# Patient Record
Sex: Female | Born: 1995 | Race: White | Hispanic: Yes | Smoking: Never smoker
Health system: Southern US, Community
[De-identification: ages and names within clinical notes are randomized; demographics above are authoritative.]

## PROBLEM LIST (undated history)

## (undated) DIAGNOSIS — Z789 Other specified health status: Secondary | ICD-10-CM

## (undated) HISTORY — PX: NO PAST SURGERIES: SHX2092

---
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
## 2018-08-23 ENCOUNTER — Encounter: Payer: Self-pay | Admitting: Emergency Medicine

## 2018-08-23 ENCOUNTER — Emergency Department: Payer: Commercial Managed Care - PPO

## 2018-08-23 ENCOUNTER — Other Ambulatory Visit: Payer: Self-pay

## 2018-08-23 ENCOUNTER — Emergency Department
Admission: EM | Admit: 2018-08-23 | Discharge: 2018-08-23 | Disposition: A | Discharge status: Home / Self Care | Payer: Commercial Managed Care - PPO | Attending: Emergency Medicine | Admitting: Emergency Medicine

## 2018-08-23 DIAGNOSIS — S80812A Abrasion, left lower leg, initial encounter: Secondary | ICD-10-CM | POA: Diagnosis not present

## 2018-08-23 DIAGNOSIS — S20419A Abrasion of unspecified back wall of thorax, initial encounter: Secondary | ICD-10-CM | POA: Diagnosis not present

## 2018-08-23 DIAGNOSIS — S40811A Abrasion of right upper arm, initial encounter: Secondary | ICD-10-CM | POA: Insufficient documentation

## 2018-08-23 DIAGNOSIS — S80811A Abrasion, right lower leg, initial encounter: Secondary | ICD-10-CM | POA: Diagnosis not present

## 2018-08-23 DIAGNOSIS — S59902A Unspecified injury of left elbow, initial encounter: Secondary | ICD-10-CM | POA: Diagnosis present

## 2018-08-23 DIAGNOSIS — Y92410 Unspecified street and highway as the place of occurrence of the external cause: Secondary | ICD-10-CM | POA: Diagnosis not present

## 2018-08-23 DIAGNOSIS — W19XXXA Unspecified fall, initial encounter: Secondary | ICD-10-CM

## 2018-08-23 DIAGNOSIS — S30810A Abrasion of lower back and pelvis, initial encounter: Secondary | ICD-10-CM | POA: Insufficient documentation

## 2018-08-23 DIAGNOSIS — S40812A Abrasion of left upper arm, initial encounter: Secondary | ICD-10-CM | POA: Diagnosis not present

## 2018-08-23 DIAGNOSIS — S51012A Laceration without foreign body of left elbow, initial encounter: Secondary | ICD-10-CM | POA: Insufficient documentation

## 2018-08-23 DIAGNOSIS — Y998 Other external cause status: Secondary | ICD-10-CM | POA: Insufficient documentation

## 2018-08-23 DIAGNOSIS — Y9389 Activity, other specified: Secondary | ICD-10-CM | POA: Diagnosis not present

## 2018-08-23 DIAGNOSIS — T07XXXA Unspecified multiple injuries, initial encounter: Secondary | ICD-10-CM

## 2018-08-23 MED ORDER — LIDOCAINE-EPINEPHRINE 2 %-1:100000 IJ SOLN
INTRAMUSCULAR | Status: AC
  Administered 2018-08-23: 10 mL
  Filled 2018-08-23: qty 1

## 2018-08-23 MED ORDER — LIDOCAINE-EPINEPHRINE 1 %-1:100000 IJ SOLN: 10.0000 mL | Freq: Once | INTRAMUSCULAR | Status: DC

## 2018-08-23 MED ORDER — IBUPROFEN 400 MG PO TABS
400.0000 mg | ORAL_TABLET | Freq: Once | ORAL | Status: DC
  Filled 2018-08-23: qty 1

## 2018-08-23 NOTE — ED Notes (Signed)
Boyfriend arrived at bedside
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
## 2019-05-16 ENCOUNTER — Emergency Department
Admission: EM | Admit: 2019-05-16 | Discharge: 2019-05-16 | Disposition: A | Discharge status: Home / Self Care | Payer: Commercial Managed Care - PPO | Attending: Emergency Medicine | Admitting: Emergency Medicine

## 2019-05-16 ENCOUNTER — Emergency Department: Payer: Commercial Managed Care - PPO

## 2019-05-16 ENCOUNTER — Other Ambulatory Visit: Payer: Self-pay

## 2019-05-16 DIAGNOSIS — Y92008 Other place in unspecified non-institutional (private) residence as the place of occurrence of the external cause: Secondary | ICD-10-CM | POA: Diagnosis not present

## 2019-05-16 DIAGNOSIS — O9989 Other specified diseases and conditions complicating pregnancy, childbirth and the puerperium: Secondary | ICD-10-CM | POA: Insufficient documentation

## 2019-05-16 DIAGNOSIS — R109 Unspecified abdominal pain: Secondary | ICD-10-CM | POA: Diagnosis not present

## 2019-05-16 DIAGNOSIS — Y9389 Activity, other specified: Secondary | ICD-10-CM | POA: Insufficient documentation

## 2019-05-16 DIAGNOSIS — S3991XA Unspecified injury of abdomen, initial encounter: Secondary | ICD-10-CM | POA: Diagnosis present

## 2019-05-16 DIAGNOSIS — W2209XA Striking against other stationary object, initial encounter: Secondary | ICD-10-CM | POA: Insufficient documentation

## 2019-05-16 DIAGNOSIS — Z3A01 Less than 8 weeks gestation of pregnancy: Secondary | ICD-10-CM | POA: Diagnosis not present

## 2019-05-16 DIAGNOSIS — Y999 Unspecified external cause status: Secondary | ICD-10-CM | POA: Diagnosis not present

## 2019-05-16 DIAGNOSIS — O26891 Other specified pregnancy related conditions, first trimester: Secondary | ICD-10-CM

## 2019-05-16 LAB — URINALYSIS, COMPLETE (UACMP) WITH MICROSCOPIC
Bacteria, UA: NONE SEEN
Bilirubin Urine: NEGATIVE
Glucose, UA: NEGATIVE mg/dL
Hgb urine dipstick: NEGATIVE
Ketones, ur: 20 mg/dL — AB
Leukocytes,Ua: NEGATIVE
Nitrite: NEGATIVE
Protein, ur: NEGATIVE mg/dL
Specific Gravity, Urine: 1.027 (ref 1.005–1.030)
pH: 6 (ref 5.0–8.0)

## 2019-05-16 LAB — POCT PREGNANCY, URINE: Preg Test, Ur: POSITIVE — AB

## 2019-05-16 LAB — HCG, QUANTITATIVE, PREGNANCY: hCG, Beta Chain, Quant, S: 41 m[IU]/mL — ABNORMAL HIGH (ref <5)

## 2019-05-16 NOTE — Discharge Instructions (Addendum)
Call and schedule an appointment with OB/GYN. Return to the ER for increase in abdominal pain or onset of vaginal bleeding.

## 2019-05-16 NOTE — ED Triage Notes (Signed)
Pt states she was moving a lot of furniture yesterday and one of the corner tables got pushed into her stomach and having pain, states she also took a pregnancy test and it was positive and is concerned due to injury

## 2019-05-16 NOTE — ED Provider Notes (Signed)
[facility] Emergency Department Provider Note  ____________________________________________  Time seen: Approximately 10:48 AM  I have reviewed the triage vital signs and the nursing notes.   HISTORY  Chief Complaint Abdominal Injury    HPI Rachel Waters is a 23 y.o. female who presents to the emergency department for treatment and evaluation of abdominal pain. She was moving furniture yesterday and the corner of a table jammed into her stomach. Upon awakening this morning, she was having stomach cramps. Her last menstrual cycle was April 13, so she decided to take a pregnancy test which was positive. Because she was having pain in her stomach she decided to come to the ER. G1P0   History reviewed. No pertinent past medical history.  There are no active problems to display for this patient.   History reviewed. No pertinent surgical history.  Prior to Admission medications   Not on File    Allergies Patient has no known allergies.  No family history on file.  Social History Social History   Tobacco Use  . Smoking status: Never Smoker  . Smokeless tobacco: Never Used  Substance Use Topics  . Alcohol use: Not Currently  . Drug use: Not Currently    Review of Systems Constitutional: Negative for fever. Respiratory: Negative for shortness of breath or cough. Gastrointestinal: Positive for abdominal pain; negative for nausea , negative for vomiting. Genitourinary: Negative for dysuria , Negative for vaginal discharge. Negative for vaginal bleeding. Musculoskeletal: Negative for back pain. Skin: Negative for acute skin changes/rash/lesion. ____________________________________________   PHYSICAL EXAM:  VITAL SIGNS: ED Triage Vitals  Enc Vitals Group     BP 05/16/19 0932 119/68     Pulse Rate 05/16/19 0932 80     Resp 05/16/19 0932 18     Temp 05/16/19 0932 98.6 F (37 C)     Temp Source 05/16/19 0932 Oral     SpO2 05/16/19 0932 99 %     Weight 05/16/19 0933 98 lb (44.5 kg)     Height 05/16/19 0933 5\' 1"  (1.549 m)     Head Circumference --      Peak Flow --      Pain Score 05/16/19 0932 5     Pain Loc --      Pain Edu? --      Excl. in GC? --     Constitutional: Alert and oriented. Well appearing and in no acute distress. Eyes: Conjunctivae are normal. Head: Atraumatic. Nose: No congestion/rhinnorhea. Mouth/Throat: Mucous membranes are moist. Respiratory: Normal respiratory effort.  No retractions. Gastrointestinal: Bowel sounds active x 4; Abdomen is soft without rebound or guarding. Genitourinary: Pelvic exam: not indicated. Musculoskeletal: No extremity tenderness nor edema.  Neurologic:  Normal speech and language. No gross focal neurologic deficits are appreciated. Speech is normal. No gait instability. Skin:  Skin is warm, dry and intact. No rash noted on exposed skin. Psychiatric: Mood and affect are normal. Speech and behavior are normal.  ____________________________________________   LABS (all labs ordered are listed, but only abnormal results are displayed)  Labs Reviewed  URINALYSIS, COMPLETE (UACMP) WITH MICROSCOPIC - Abnormal; Notable for the following components:      Result Value   Color, Urine YELLOW (*)    APPearance CLEAR (*)    Ketones, ur 20 (*)    All other components within normal limits  HCG, QUANTITATIVE, PREGNANCY - Abnormal; Notable for the following components:   hCG, Beta Chain, Quant, S 41 (*)    All other components  within normal limits  POCT PREGNANCY, URINE - Abnormal; Notable for the following components:   Preg Test, Ur POSITIVE (*)    All other components within normal limits  POC URINE PREG, ED   ____________________________________________  RADIOLOGY  US shows no IUP, adnexal mass, or significant free fluid. ____________________________________________  Procedures  ____________________________________________  23 year old female presents with abdominal  pain. Urine pregnancy test here is also positive. She will go for [place] after beta HCG is back. No ABO/RH drawn as she does not have any vaginal bleeding.   [place] does not confirm IUP, however beta HCG is very low at 41. No adnexal mass is visualized, which is reassuring.   Patient will be discharged home with strict ER return precautions. She was given a referral to GYN and is to call and schedule an appointment.  INITIAL IMPRESSION / ASSESSMENT AND PLAN / ED COURSE  Pertinent labs & imaging results that were available during my care of the patient were reviewed by me and considered in my medical decision making (see chart for details).  ____________________________________________   FINAL CLINICAL IMPRESSION(S) / ED DIAGNOSES  Final diagnoses:  Less than [redacted] weeks gestation of pregnancy  Abdominal pain during pregnancy in first trimester    Note:  This document was prepared using Dragon voice recognition software and may include unintentional dictation errors.   Chinita Pester, FNP 05/18/19 [id number]    Arnaldo Natal, MD 05/19/19 [phone]

## 2019-05-21 ENCOUNTER — Telehealth: Payer: Self-pay

## 2019-05-21 NOTE — Telephone Encounter (Signed)
Coronavirus (COVID-19) Are you at risk?  Are you at risk for the Coronavirus (COVID-19)?  To be considered HIGH RISK for Coronavirus (COVID-19), you have to meet the following criteria:  . Traveled to [place], [place], [place], [place] or [place]; or in the [place] to [place], [place], [place], or [place]; and have fever, cough, and shortness of breath within the last 2 weeks of travel OR . Been in close contact with a person diagnosed with COVID-19 within the last 2 weeks and have fever, cough, and shortness of breath . IF YOU DO NOT MEET THESE CRITERIA, YOU ARE CONSIDERED LOW RISK FOR COVID-19.  What to do if you are HIGH RISK for COVID-19?  . If you are having a medical emergency, call 911. . Seek medical care right away. Before you go to a doctor's office, urgent care or emergency department, call ahead and tell them about your recent travel, contact with someone diagnosed with COVID-19, and your symptoms. You should receive instructions from your physician's office regarding next steps of care.  . When you arrive at healthcare provider, tell the healthcare staff immediately you have returned from visiting [place], [place], [place], [place] or [place]; or traveled in the [place] to [place], [place], [place], or [place]; in the last two weeks or you have been in close contact with a person diagnosed with COVID-19 in the last 2 weeks.   . Tell the health care staff about your symptoms: fever, cough and shortness of breath. . After you have been seen by a medical provider, you will be either: o Tested for (COVID-19) and discharged home on quarantine except to seek medical care if symptoms worsen, and asked to  - Stay home and avoid contact with others until you get your results (4-5 days)  - Avoid travel on public transportation if possible (such as bus, train, or airplane) or o Sent to the Emergency Department by EMS for evaluation, COVID-19 testing, and possible  admission depending on your condition and test results.  What to do if you are LOW RISK for COVID-19?  Reduce your risk of any infection by using the same precautions used for avoiding the common cold or flu:  . Wash your hands often with soap and warm water for at least 20 seconds.  If soap and water are not readily available, use an alcohol-based hand sanitizer with at least 60% alcohol.  . If coughing or sneezing, cover your mouth and nose by coughing or sneezing into the elbow areas of your shirt or coat, into a tissue or into your sleeve (not your hands). . Avoid shaking hands with others and consider head nods or verbal greetings only. . Avoid touching your eyes, nose, or mouth with unwashed hands.  . Avoid close contact with people who are Garrell Flagg. . Avoid places or events with large numbers of people in one location, like concerts or sporting events. . Carefully consider travel plans you have or are making. . If you are planning any travel outside or inside the [place], visit the CDC's Travelers' Health webpage for the latest health notices. . If you have some symptoms but not all symptoms, continue to monitor at home and seek medical attention if your symptoms worsen. . If you are having a medical emergency, call 911.  05/21/19 SCREENING NEG SLS ADDITIONAL HEALTHCARE OPTIONS FOR PATIENTS  [facility] / e-Visit: [email or web address]         [facility]: [phone]    [facility]: [phone]                   [facility]: [phone]  

## 2019-05-24 ENCOUNTER — Ambulatory Visit (INDEPENDENT_AMBULATORY_CARE_PROVIDER_SITE_OTHER): Payer: Commercial Managed Care - PPO | Admitting: Certified Nurse Midwife

## 2019-05-24 ENCOUNTER — Other Ambulatory Visit: Payer: Self-pay

## 2019-05-24 ENCOUNTER — Encounter: Payer: Self-pay | Admitting: Certified Nurse Midwife

## 2019-05-24 VITALS — BP 108/71 | HR 71 | Ht 61.0 in | Wt 99.1 lb

## 2019-05-24 DIAGNOSIS — N926 Irregular menstruation, unspecified: Secondary | ICD-10-CM

## 2019-05-24 DIAGNOSIS — Z8759 Personal history of other complications of pregnancy, childbirth and the puerperium: Secondary | ICD-10-CM | POA: Insufficient documentation

## 2019-05-24 LAB — POCT URINE PREGNANCY: Preg Test, Ur: NEGATIVE

## 2019-05-24 NOTE — Progress Notes (Signed)
Patient went to ER 5/24 due to abdominal cramping after hitting abdomen while moving furniture, had labs and ultrasound.  Patient was told her hcg beta was 41.  Patient started having VB 5/28 and currently still spotting.  Patient went to ER while OOT and hcg beta was 11.

## 2019-05-24 NOTE — Progress Notes (Addendum)
GYN ENCOUNTER NOTE  Subjective:       Rachel Waters is a 23 y.o. G21P0010 female here for confirmation of pregnancy.   Seen in [facility] ER on 05/16/2019 with positive urine pregnancy test and beta of 41. Notes repeat ER visit in [place] on 05/20/2019 for vaginal bleeding and beta 11.  Spotting today. No vaginal pain or odor. Desires pregnancy. Currently taking prenatal vitamin with folic acid and DHA.   Denies difficulty breathing or respiratory distress, chest pain, abdominal pain, excessive vaginal bleeding, dysuria, and leg pain or swelling.    Gynecologic History  Patient's last menstrual period was 04/05/2019 (exact date).  Contraception: none  Last Pap: N/A.   Obstetric History  OB History  Gravida Para Term Preterm AB Living  2 0 0 0 1 0  SAB TAB Ectopic Multiple Live Births  0 1 0 0 0    # Outcome Date GA Lbr Len/2nd Weight Sex Delivery Anes PTL Lv  2 TAB 2018          1 Gravida             Current Outpatient Medications on File Prior to Visit  Medication Sig Dispense Refill  . Prenatal Vit-Fe Fumarate-FA (MULTIVITAMIN-PRENATAL) 27-0.8 MG TABS tablet Take 1 tablet by mouth daily at 12 noon.     No current facility-administered medications on file prior to visit.     No Known Allergies  Social History   Socioeconomic History  . Marital status: Single    Spouse name: Not on file  . Number of children: Not on file  . Years of education: Not on file  . Highest education level: Not on file  Occupational History  . Not on file  Social Needs  . Financial resource strain: Not on file  . Food insecurity:    Worry: Not on file    Inability: Not on file  . Transportation needs:    Medical: Not on file    Non-medical: Not on file  Tobacco Use  . Smoking status: Never Smoker  . Smokeless tobacco: Never Used  Substance and Sexual Activity  . Alcohol use: Not Currently    Comment: occasional   . Drug use: Never  . Sexual activity: Yes    Birth  control/protection: None  Lifestyle  . Physical activity:    Days per week: Not on file    Minutes per session: Not on file  . Stress: Not on file  Relationships  . Social connections:    Talks on phone: Not on file    Gets together: Not on file    Attends religious service: Not on file    Active member of club or organization: Not on file    Attends meetings of clubs or organizations: Not on file    Relationship status: Not on file  . Intimate partner violence:    Fear of current or ex partner: Not on file    Emotionally abused: Not on file    Physically abused: Not on file    Forced sexual activity: Not on file  Other Topics Concern  . Not on file  Social History Narrative  . Not on file    Family History  Problem Relation Age of Onset  . Breast cancer Neg Hx   . Ovarian cancer Neg Hx   . Colon cancer Neg Hx     The following portions of the patient's history were reviewed and updated as appropriate: allergies, current medications, past family history,  past medical history, past social history, past surgical history and problem list.  Review of Systems  ROS negative except as noted above. Information obtained from patient.   Objective:   BP 108/71   Pulse 71   Ht 5\' 1"  (1.549 m)   Wt 99 lb 1.6 oz (45 kg)   LMP 04/05/2019 (Exact Date)   Breastfeeding Unknown   BMI 18.72 kg/m    CONSTITUTIONAL: Well-developed, well-nourished female in no acute distress.   PHYSICAL EXAM: Not indicated.   Recent Results (from the past 2160 hour(s))  Urinalysis, Complete w Microscopic     Status: Abnormal   Collection Time: 05/16/19 10:03 AM  Result Value Ref Range   Color, Urine YELLOW (A) YELLOW   APPearance CLEAR (A) CLEAR   Specific Gravity, Urine 1.027 1.005 - 1.030   pH 6.0 5.0 - 8.0   Glucose, UA NEGATIVE NEGATIVE mg/dL   Hgb urine dipstick NEGATIVE NEGATIVE   Bilirubin Urine NEGATIVE NEGATIVE   Ketones, ur 20 (A) NEGATIVE mg/dL   Protein, ur NEGATIVE NEGATIVE  mg/dL   Nitrite NEGATIVE NEGATIVE   Leukocytes,Ua NEGATIVE NEGATIVE   WBC, UA 0-5 0 - 5 WBC/hpf   Bacteria, UA NONE SEEN NONE SEEN   Squamous Epithelial / LPF 0-5 0 - 5   Mucus PRESENT     Comment: Performed at [facility], [address] [place], [place] [address]  Pregnancy, urine POC     Status: Abnormal   Collection Time: 05/16/19 10:26 AM  Result Value Ref Range   Preg Test, Ur POSITIVE (A) NEGATIVE    Comment:        THE SENSITIVITY OF THIS METHODOLOGY IS >24 mIU/mL   hCG, quantitative, pregnancy     Status: Abnormal   Collection Time: 05/16/19 11:03 AM  Result Value Ref Range   hCG, Beta Chain, Quant, S 41 (H) <5 mIU/mL    Comment:          GEST. AGE      CONC.  (mIU/mL)   <=1 WEEK        5 - 50     2 WEEKS       50 - 500     3 WEEKS       100 - 10,000     4 WEEKS     1,000 - 30,000     5 WEEKS     3,500 - 115,000   6-8 WEEKS     12,000 - 270,000    12 WEEKS     15,000 - 220,000        FEMALE AND NON-PREGNANT FEMALE:     LESS THAN 5 mIU/mL Performed at [facility], [address] [place], [place] [address]   POCT urine pregnancy     Status: None   Collection Time: 05/24/19  2:25 PM  Result Value Ref Range   Preg Test, Ur Negative Negative    Assessment:   1. Missed menses - Beta HCG, Quant   Plan:   Emotional support provided.   Discussed pregnancy loss, see AVS.   Beta today, see orders. Will contact patient with results.   Encouraged normal period then restarting attempts for pregnancy with cycle tracking and use of ovulation test strips.   Reviewed red flag symptoms and when to call.   RTC as needed.    Gunnar BullaJenkins Michelle Svara Twyman, CNM [facility], [facility] 05/24/19 2:43 PM    A total of 20 minutes were spent face-to-face  with the patient during this encounter and over half of that time dealt with counseling and coordination of care.

## 2019-05-24 NOTE — Patient Instructions (Signed)
Preparing for Pregnancy If you are considering becoming pregnant, make an appointment to see your regular health care provider to learn how to prepare for a safe and healthy pregnancy (preconception care). During a preconception care visit, your health care provider will:  Do a complete physical exam, including a Pap test.  Take a complete medical history.  Give you information, answer your questions, and help you resolve problems. Preconception checklist Medical history  Tell your health care provider about any current or past medical conditions. Your pregnancy or your ability to become pregnant may be affected by chronic conditions, such as diabetes, chronic hypertension, and thyroid problems.  Include your family's medical history as well as your partner's medical history.  Tell your health care provider about any history of STIs (sexually transmitted infections).These can affect your pregnancy. In some cases, they can be passed to your baby. Discuss any concerns that you have about STIs.  If indicated, discuss the benefits of genetic testing. This testing will show whether there are any genetic conditions that may be passed from you or your partner to your baby.  Tell your health care provider about: ? Any problems you have had with conception or pregnancy. ? Any medicines you take. These include vitamins, herbal supplements, and over-the-counter medicines. ? Your history of immunizations. Discuss any vaccinations that you may need. Diet  Ask your health care provider what to include in a healthy diet that has a balance of nutrients. This is especially important when you are pregnant or preparing to become pregnant.  Ask your health care provider to help you reach a healthy weight before pregnancy. ? If you are overweight, you may be at higher risk for certain complications, such as high blood pressure, diabetes, and preterm birth. ? If you are underweight, you are more likely to  have a baby who has a low birth weight. Lifestyle, work, and home  Let your health care provider know: ? About any lifestyle habits that you have, such as alcohol use, drug use, or smoking. ? About recreational activities that may put you at risk during pregnancy, such as downhill skiing and certain exercise programs. ? Tell your health care provider about any international travel, especially any travel to places with an active Zika virus outbreak. ? About harmful substances that you may be exposed to at work or at home. These include chemicals, pesticides, radiation, or even litter boxes. ? If you do not feel safe at home. Mental health  Tell your health care provider about: ? Any history of mental health conditions, including feelings of depression, sadness, or anxiety. ? Any medicines that you take for a mental health condition. These include herbs and supplements. Home instructions to prepare for pregnancy Lifestyle   Eat a balanced diet. This includes fresh fruits and vegetables, whole grains, lean meats, low-fat dairy products, healthy fats, and foods that are high in fiber. Ask to meet with a nutritionist or registered dietitian for assistance with meal planning and goals.  Get regular exercise. Try to be active for at least 30 minutes a day on most days of the week. Ask your health care provider which activities are safe during pregnancy.  Do not use any products that contain nicotine or tobacco, such as cigarettes and e-cigarettes. If you need help quitting, ask your health care provider.  Do not drink alcohol.  Do not take illegal drugs.  Maintain a healthy weight. Ask your health care provider what weight range is right for you. General   instructions  Keep an accurate record of your menstrual periods. This makes it easier for your health care provider to determine your baby's due date.  Begin taking prenatal vitamins and folic acid supplements daily as directed by your  health care provider.  Manage any chronic conditions, such as high blood pressure and diabetes, as told by your health care provider. This is important. How do I know that I am pregnant? You may be pregnant if you have been sexually active and you miss your period. Symptoms of early pregnancy include:  Mild cramping.  Very light vaginal bleeding (spotting).  Feeling unusually tired.  Nausea and vomiting (morning sickness). If you have any of these symptoms and you suspect that you might be pregnant, you can take a home pregnancy test. These tests check for a hormone in your urine (human chorionic gonadotropin, or hCG). A woman's body begins to make this hormone during early pregnancy. These tests are very accurate. Wait until at least the first day after you miss your period to take one. If the test shows that you are pregnant (you get a positive result), call your health care provider to make an appointment for prenatal care. What should I do if I become pregnant?      Make an appointment with your health care provider as soon as you suspect you are pregnant.  Do not use any products that contain nicotine, such as cigarettes, chewing tobacco, and e-cigarettes. If you need help quitting, ask your health care provider.  Do not drink alcoholic beverages. Alcohol is related to a number of birth defects.  Avoid toxic odors and chemicals.  You may continue to have sexual intercourse if it does not cause pain or other problems, such as vaginal bleeding. This information is not intended to replace advice given to you by your health care provider. Make sure you discuss any questions you have with your health care provider. Document Released: 11/21/2008 Document Revised: 12/11/2017 Document Reviewed: 06/30/2016 Elsevier Interactive Patient Education  2019 [employer].   Miscarriage A miscarriage is the loss of an unborn baby (fetus) before the 20th week of pregnancy. Follow these  instructions at home: Medicines   Take over-the-counter and prescription medicines only as told by your doctor.  If you were prescribed antibiotic medicine, take it as told by your doctor. Do not stop taking the antibiotic even if you start to feel better.  Do not take NSAIDs unless your doctor says that this is safe for you. NSAIDs include aspirin and ibuprofen. These medicines can cause bleeding. Activity  Rest as directed. Ask your doctor what activities are safe for you.  Have someone help you at home during this time. General instructions  Write down how many pads you use each day and how soaked they are.  Watch the amount of tissue or clumps of blood (blood clots) that you pass from your vagina. Save any large amounts of tissue for your doctor.  Do not use tampons, douche, or have sex until your doctor approves.  To help you and your partner with the process of grieving, talk with your doctor or seek counseling.  When you are ready, meet with your doctor to talk about steps you should take for your health. Also, talk with your doctor about steps to take to have a healthy pregnancy in the future.  Keep all follow-up visits as told by your doctor. This is important. Contact a doctor if:  You have a fever or chills.  You  have vaginal discharge that smells bad.  You have more bleeding. Get help right away if:  You have very bad cramps or pain in your back or belly.  You pass clumps of blood that are walnut-sized or larger from your vagina.  You pass tissue that is walnut-sized or larger from your vagina.  You soak more than 1 regular pad in an hour.  You get light-headed or weak.  You faint (pass out).  You have feelings of sadness that do not go away, or you have thoughts of hurting yourself. Summary  A miscarriage is the loss of an unborn baby before the 20th week of pregnancy.  Follow your doctor's instructions for home care. Keep all follow-up appointments.   To help you and your partner with the process of grieving, talk with your doctor or seek counseling. This information is not intended to replace advice given to you by your health care provider. Make sure you discuss any questions you have with your health care provider. Document Released: 03/02/2012 Document Revised: 01/14/2017 Document Reviewed: 01/14/2017 Elsevier Interactive Patient Education  2019 [employer]

## 2019-05-25 LAB — BETA HCG QUANT (REF LAB): hCG Quant: <1 m[IU]/mL

## 2019-05-26 NOTE — Progress Notes (Signed)
Please contact patient. Beta negative for pregnancy. Encourage activation of MyChart. Thanks, JML

## 2019-09-20 ENCOUNTER — Other Ambulatory Visit: Payer: Self-pay

## 2019-09-20 ENCOUNTER — Encounter: Payer: Self-pay | Admitting: Certified Nurse Midwife

## 2019-09-20 ENCOUNTER — Ambulatory Visit (INDEPENDENT_AMBULATORY_CARE_PROVIDER_SITE_OTHER): Payer: Commercial Managed Care - PPO | Admitting: Certified Nurse Midwife

## 2019-09-20 VITALS — BP 110/65 | HR 79 | Ht 61.0 in | Wt 107.4 lb

## 2019-09-20 DIAGNOSIS — N926 Irregular menstruation, unspecified: Secondary | ICD-10-CM

## 2019-09-20 DIAGNOSIS — Z8759 Personal history of other complications of pregnancy, childbirth and the puerperium: Secondary | ICD-10-CM | POA: Diagnosis not present

## 2019-09-20 LAB — POCT URINE PREGNANCY: Preg Test, Ur: POSITIVE — AB

## 2019-09-20 NOTE — Patient Instructions (Signed)
Round Ligament Pain  The round ligament is a cord of muscle and tissue that helps support the uterus. It can become a source of pain during pregnancy if it becomes stretched or twisted as the baby grows. The pain usually begins in the second trimester (13-28 weeks) of pregnancy, and it can come and go until the baby is delivered. It is not a serious problem, and it does not cause harm to the baby. Round ligament pain is usually a short, sharp, and pinching pain, but it can also be a dull, lingering, and aching pain. The pain is felt in the lower side of the abdomen or in the groin. It usually starts deep in the groin and moves up to the outside of the hip area. The pain may occur when you:  Suddenly change position, such as quickly going from a sitting to standing position.  Roll over in bed.  Cough or sneeze.  Do physical activity. Follow these instructions at home:   Watch your condition for any changes.  When the pain starts, relax. Then try any of these methods to help with the pain: ? Sitting down. ? Flexing your knees up to your abdomen. ? Lying on your side with one pillow under your abdomen and another pillow between your legs. ? Sitting in a warm bath for 15-20 minutes or until the pain goes away.  Take over-the-counter and prescription medicines only as told by your health care provider.  Move slowly when you sit down or stand up.  Avoid long walks if they cause pain.  Stop or reduce your physical activities if they cause pain.  Keep all follow-up visits as told by your health care provider. This is important. Contact a health care provider if:  Your pain does not go away with treatment.  You feel pain in your back that you did not have before.  Your medicine is not helping. Get help right away if:  You have a fever or chills.  You develop uterine contractions.  You have vaginal bleeding.  You have nausea or vomiting.  You have diarrhea.  You have pain  when you urinate. Summary  Round ligament pain is felt in the lower abdomen or groin. It is usually a short, sharp, and pinching pain. It can also be a dull, lingering, and aching pain.  This pain usually begins in the second trimester (13-28 weeks). It occurs because the uterus is stretching with the growing baby, and it is not harmful to the baby.  You may notice the pain when you suddenly change position, when you cough or sneeze, or during physical activity.  Relaxing, flexing your knees to your abdomen, lying on one side, or taking a warm bath may help to get rid of the pain.  Get help from your health care provider if the pain does not go away or if you have vaginal bleeding, nausea, vomiting, diarrhea, or painful urination. This information is not intended to replace advice given to you by your health care provider. Make sure you discuss any questions you have with your health care provider. Document Released: 09/17/2008 Document Revised: 05/27/2018 Document Reviewed: 05/27/2018 Elsevier Patient Education  [place]. Back Pain in Pregnancy Back pain during pregnancy is common. Back pain may be caused by several factors that are related to changes during your pregnancy. Follow these instructions at home: Managing pain, stiffness, and swelling      If directed, for sudden (acute) back pain, put ice on the painful  area. ? Put ice in a plastic bag. ? Place a towel between your skin and the bag. ? Leave the ice on for 20 minutes, 2-3 times per day.  If directed, apply heat to the affected area before you exercise. Use the heat source that your health care provider recommends, such as a moist heat pack or a heating pad. ? Place a towel between your skin and the heat source. ? Leave the heat on for 20-30 minutes. ? Remove the heat if your skin turns bright red. This is especially important if you are unable to feel pain, heat, or cold. You may have a greater risk of getting  burned.  If directed, massage the affected area. Activity  Exercise as told by your health care provider. Gentle exercise is the best way to prevent or manage back pain.  Listen to your body when lifting. If lifting hurts, ask for help or bend your knees. This uses your leg muscles instead of your back muscles.  Squat down when picking up something from the floor. Do not bend over.  Only use bed rest for short periods as told by your health care provider. Bed rest should only be used for the most severe episodes of back pain. Standing, sitting, and lying down  Do not stand in one place for long periods of time.  Use good posture when sitting. Make sure your head rests over your shoulders and is not hanging forward. Use a pillow on your lower back if necessary.  Try sleeping on your side, preferably the left side, with a pregnancy support pillow or 1-2 regular pillows between your legs. ? If you have back pain after a night's rest, your bed may be too soft. ? A firm mattress may provide more support for your back during pregnancy. General instructions  Do not wear high heels.  Eat a healthy diet. Try to gain weight within your health care provider's recommendations.  Use a maternity girdle, elastic sling, or back brace as told by your health care provider.  Take over-the-counter and prescription medicines only as told by your health care provider.  Work with a physical therapist or massage therapist to find ways to manage back pain. Acupuncture or massage therapy may be helpful.  Keep all follow-up visits as told by your health care provider. This is important. Contact a health care provider if:  Your back pain interferes with your daily activities.  You have increasing pain in other parts of your body. Get help right away if:  You develop numbness, tingling, weakness, or problems with the use of your arms or legs.  You develop severe back pain that is not controlled with  medicine.  You have a change in bowel or bladder control.  You develop shortness of breath, dizziness, or you faint.  You develop nausea, vomiting, or sweating.  You have back pain that is a rhythmic, cramping pain similar to labor pains. Labor pain is usually 1-2 minutes apart, lasts for about 1 minute, and involves a bearing down feeling or pressure in your pelvis.  You have back pain and your water breaks or you have vaginal bleeding.  You have back pain or numbness that travels down your leg.  Your back pain developed after you fell.  You develop pain on one side of your back.  You see blood in your urine.  You develop skin blisters in the area of your back pain. Summary  Back pain may be caused by several factors  that are related to changes during your pregnancy.  Follow instructions as told by your health care provider for managing pain, stiffness, and swelling.  Exercise as told by your health care provider. Gentle exercise is the best way to prevent or manage back pain.  Take over-the-counter and prescription medicines only as told by your health care provider.  Keep all follow-up visits as told by your health care provider. This is important. This information is not intended to replace advice given to you by your health care provider. Make sure you discuss any questions you have with your health care provider. Document Released: 03/19/2006 Document Revised: 03/30/2019 Document Reviewed: 05/27/2018 Elsevier Patient Education  [place]. Morning Sickness  Morning sickness is when you feel sick to your stomach (nauseous) during pregnancy. You may feel sick to your stomach and throw up (vomit). You may feel sick in the morning, but you can feel this way at any time of day. Some women feel very sick to their stomach and cannot stop throwing up (hyperemesis gravidarum). Follow these instructions at home: Medicines  Take over-the-counter and prescription medicines  only as told by your doctor. Do not take any medicines until you talk with your doctor about them first.  Taking multivitamins before getting pregnant can stop or lessen the harshness of morning sickness. Eating and drinking  Eat dry toast or crackers before getting out of bed.  Eat 5 or 6 small meals a day.  Eat dry and bland foods like rice and baked potatoes.  Do not eat greasy, fatty, or spicy foods.  Have someone cook for you if the smell of food causes you to feel sick or throw up.  If you feel sick to your stomach after taking prenatal vitamins, take them at night or with a snack.  Eat protein when you need a snack. Nuts, yogurt, and cheese are good choices.  Drink fluids throughout the day.  Try ginger ale made with real ginger, ginger tea made from fresh grated ginger, or ginger candies. General instructions  Do not use any products that have nicotine or tobacco in them, such as cigarettes and e-cigarettes. If you need help quitting, ask your doctor.  Use an air purifier to keep the air in your house free of smells.  Get lots of fresh air.  Try to avoid smells that make you feel sick.  Try: ? Wearing a bracelet that is used for seasickness (acupressure wristband). ? Going to a doctor who puts thin needles into certain body points (acupuncture) to improve how you feel. Contact a doctor if:  You need medicine to feel better.  You feel dizzy or light-headed.  You are losing weight. Get help right away if:  You feel very sick to your stomach and cannot stop throwing up.  You pass out (faint).  You have very bad pain in your belly. Summary  Morning sickness is when you feel sick to your stomach (nauseous) during pregnancy.  You may feel sick in the morning, but you can feel this way at any time of day.  Making some changes to what you eat may help your symptoms go away. This information is not intended to replace advice given to you by your health care  provider. Make sure you discuss any questions you have with your health care provider. Document Released: 01/16/2005 Document Revised: 11/21/2017 Document Reviewed: 01/09/2017 Elsevier Patient Education  2020 [place] for Pregnant Women While you are pregnant, your body requires additional  nutrition to help support your growing baby. You also have a higher need for some vitamins and minerals, such as folic acid, calcium, iron, and vitamin D. Eating a healthy, well-balanced diet is very important for your health and your baby's health. Your need for extra calories varies for the three 35-monthsegments of your pregnancy (trimesters). For most women, it is recommended to consume:  150 extra calories a day during the first trimester.  300 extra calories a day during the second trimester.  300 extra calories a day during the third trimester. What are tips for following this plan?   Do not try to lose weight or go on a diet during pregnancy.  Limit your overall intake of foods that have "empty calories." These are foods that have little nutritional value, such as sweets, desserts, candies, and sugar-sweetened beverages.  Eat a variety of foods (especially fruits and vegetables) to get a full range of vitamins and minerals.  Take a prenatal vitamin to help meet your additional vitamin and mineral needs during pregnancy, specifically for folic acid, iron, calcium, and vitamin D.  Remember to stay active. Ask your health care provider what types of exercise and activities are safe for you.  Practice good food safety and cleanliness. Wash your hands before you eat and after you prepare raw meat. Wash all fruits and vegetables well before peeling or eating. Taking these actions can help to prevent food-borne illnesses that can be very dangerous to your baby, such as listeriosis. Ask your health care provider for more information about listeriosis. What does 150 extra calories look  like? Healthy options that provide 150 extra calories each day could be any of the following:  6-8 oz (170-230 g) of plain low-fat yogurt with  cup of berries.  1 apple with 2 teaspoons (11 g) of peanut butter.  Cut-up vegetables with  cup (60 g) of hummus.  8 oz (230 mL) or 1 cup of low-fat chocolate milk.  1 stick of string cheese with 1 medium orange.  1 peanut butter and jelly sandwich that is made with one slice of whole-wheat bread and 1 tsp (5 g) of peanut butter. For 300 extra calories, you could eat two of those healthy options each day. What is a healthy amount of weight to gain? The right amount of weight gain for you is based on your BMI before you became pregnant. If your BMI:  Was less than 18 (underweight), you should gain 28-40 lb (13-18 kg).  Was 18-24.9 (normal), you should gain 25-35 lb (11-16 kg).  Was 25-29.9 (overweight), you should gain 15-25 lb (7-11 kg).  Was 30 or greater (obese), you should gain 11-20 lb (5-9 kg). What if I am having twins or multiples? Generally, if you are carrying twins or multiples:  You may need to eat 300-600 extra calories a day.  The recommended range for total weight gain is 25-54 lb (11-25 kg), depending on your BMI before pregnancy.  Talk with your health care provider to find out about nutritional needs, weight gain, and exercise that is right for you. What foods can I eat?  Grains All grains. Choose whole grains, such as whole-wheat bread, oatmeal, or brown rice. Vegetables All vegetables. Eat a variety of colors and types of vegetables. Remember to wash your vegetables well before peeling or eating. Fruits All fruits. Eat a variety of colors and types of fruit. Remember to wash your fruits well before peeling or eating. Meats and other protein foods Lean  meats, including chicken, [place], fish, and lean cuts of beef, veal, or pork. If you eat fish or seafood, choose options that are higher in omega-3 fatty acids and  lower in mercury, such as salmon, herring, mussels, trout, sardines, pollock, shrimp, crab, and lobster. Tofu. Tempeh. Beans. Eggs. Peanut butter and other nut butters. Make sure that all meats, poultry, and eggs are cooked to food-safe temperatures or "well-done." Two or more servings of fish are recommended each week in order to get the most benefits from omega-3 fatty acids that are found in seafood. Choose fish that are lower in mercury. You can find more information online:  [employer]ch Dairy Pasteurized milk and milk alternatives (such as almond milk). Pasteurized yogurt and pasteurized cheese. Cottage cheese. Sour cream. Beverages Water. Juices that contain 100% fruit juice or vegetable juice. Caffeine-free teas and decaffeinated coffee. Drinks that contain caffeine are okay to drink, but it is better to avoid caffeine. Keep your total caffeine intake to less than 200 mg each day (which is 12 oz or 355 mL of coffee, tea, or soda) or the limit as told by your health care provider. Fats and oils Fats and oils are okay to include in moderation. Sweets and desserts Sweets and desserts are okay to include in moderation. Seasoning and other foods All pasteurized condiments. The items listed above may not be a complete list of recommended foods and beverages. Contact your dietitian for more options. The items listed above may not be a complete list of foods and beverages [you/your child] can eat. Contact a dietitian for more information. What foods are not recommended? Vegetables Raw (unpasteurized) vegetable juices. Fruits Unpasteurized fruit juices. Meats and other protein foods Lunch meats, bologna, hot dogs, or other deli meats. (If you must eat those meats, reheat them until they are steaming hot.) Refrigerated pat, meat spreads from a meat counter, smoked seafood that is found in the refrigerated section of a store. Raw or undercooked meats, poultry, and eggs. Raw fish, such as sushi or  sashimi. Fish that have high mercury content, such as tilefish, shark, swordfish, and king mackerel. To learn more about mercury in fish, talk with your health care provider or look for online resources, such as:  [employer] Dairy Raw (unpasteurized) milk and any foods that have raw milk in them. Soft cheeses, such as feta, queso blanco, queso fresco, Brie, Camembert cheeses, blue-veined cheeses, and Panela cheese (unless it is made with pasteurized milk, which must be stated on the label). Beverages Alcohol. Sugar-sweetened beverages, such as sodas, teas, or energy drinks. Seasoning and other foods Homemade fermented foods and drinks, such as pickles, sauerkraut, or kombucha drinks. (Store-bought pasteurized versions of these are okay.) Salads that are made in a store or deli, such as ham salad, chicken salad, egg salad, tuna salad, and seafood salad. The items listed above may not be a complete list of foods and beverages to avoid. Contact your dietitian for more information. The items listed above may not be a complete list of foods and beverages [you/your child] should avoid. Contact a dietitian for more information. Where to find more information To calculate the number of calories you need based on your height, weight, and activity level, you can use an online calculator such as:  MobileTransition.ch To calculate how much weight you should gain during pregnancy, you can use an online pregnancy weight gain calculator such as:  StreamingFood.com.cy Summary  While you are pregnant, your body requires additional nutrition to help support your growing baby.  Eat a variety of foods, especially fruits and vegetables to get a full range of vitamins and minerals.  Practice good food safety and cleanliness. Wash your hands before you eat and after you prepare raw meat. Wash all fruits and vegetables well before peeling or eating. Taking these  actions can help to prevent food-borne illnesses, such as listeriosis, that can be very dangerous to your baby.  Do not eat raw meat or fish. Do not eat fish that have high mercury content, such as tilefish, shark, swordfish, and king mackerel. Do not eat unpasteurized (raw) dairy.  Take a prenatal vitamin to help meet your additional vitamin and mineral needs during pregnancy, specifically for folic acid, iron, calcium, and vitamin D. This information is not intended to replace advice given to you by your health care provider. Make sure you discuss any questions you have with your health care provider. Document Released: 09/23/2014 Document Revised: 04/01/2019 Document Reviewed: 09/05/2017 Elsevier Patient Education  [place]. Common Medications Safe in Pregnancy  Acne:      Constipation:  Benzoyl Peroxide     Colace  Clindamycin      Dulcolax Suppository  [place] Erythromycin     Fibercon  Salicylic Acid      Metamucil         Miralax AVOID:        Senakot   Accutane    Cough:  Retin-A       Cough Drops  Tetracycline      Phenergan w/ Codeine if Rx  Minocycline      Robitussin (Plain & DM)  Antibiotics:     Crabs/Lice:  Ceclor       RID  Cephalosporins    AVOID:  E-Mycins      Kwell  Keflex  Macrobid/Macrodantin   Diarrhea:  Penicillin      Kao-Pectate  Zithromax      Imodium AD         PUSH FLUIDS AVOID:       Cipro     Fever:  Tetracycline      Tylenol (Regular or Extra  Minocycline       Strength)  Levaquin      Extra Strength-Do not          Exceed 8 tabs/24 hrs Caffeine:        '200mg'$ /day (equiv. To 1 cup of coffee or  approx. 3 12 oz sodas)         Gas: Cold/Hayfever:       Gas-X  Benadryl      Mylicon  Claritin       Phazyme  **Claritin-D        Chlor-Trimeton    Headaches:  Dimetapp      ASA-Free Excedrin  Drixoral-Non-Drowsy     Cold Compress  Mucinex (Guaifenasin)     Tylenol (Regular or Extra  Sudafed/Sudafed-12 Hour     Strength)  **Sudafed PE  Pseudoephedrine   Tylenol Cold & Sinus     Vicks Vapor Rub  Zyrtec  **AVOID if Problems With Blood Pressure         Heartburn: Avoid lying down for at least 1 hour after meals  Aciphex      Maalox     Rash:  Milk of Magnesia     Benadryl    Mylanta       1% Hydrocortisone Cream  Pepcid  Pepcid Complete   Sleep Aids:  Prevacid      Ambien  Prilosec       Benadryl  Rolaids       Chamomile Tea  Tums (Limit 4/day)     Unisom  Zantac       Tylenol PM         Warm milk-add vanilla or  Hemorrhoids:       Sugar for taste  Anusol/Anusol H.C.  (RX: Analapram 2.5%)  Sugar Substitutes:  Hydrocortisone OTC     Ok in moderation  Preparation H      Tucks        Vaseline lotion applied to tissue with wiping    Herpes:     Throat:  Acyclovir      Oragel  Famvir  Valtrex     Vaccines:         Flu Shot Leg Cramps:       *Gardasil  Benadryl      Hepatitis A         Hepatitis B Nasal Spray:       Pneumovax  Saline Nasal Spray     Polio Booster         Tetanus Nausea:       Tuberculosis test or PPD  Vitamin B6 25 mg TID   AVOID:    Dramamine      *Gardasil  Emetrol       Live Poliovirus  Ginger Root 250 mg QID    MMR (measles, mumps &  High Complex Carbs @ Bedtime    rebella)  Sea Bands-Accupressure    Varicella (Chickenpox)  Unisom 1/2 tab TID     *No known complications           If received before Pain:         Known pregnancy;   Darvocet       Resume series after  Lortab        Delivery  Percocet    Yeast:   Tramadol      Femstat  Tylenol 3      Gyne-lotrimin  Ultram       Monistat  Vicodin           MISC:         All Sunscreens           Hair Coloring/highlights          Insect Repellant's          (Including DEET)         Mystic Tans First Trimester of Pregnancy  The first trimester of pregnancy is from week 1 until the end of week 13 (months 1 through 3). During this time, your baby will begin to develop inside you. At 6-8 weeks, the eyes and face are formed,  and the heartbeat can be seen on ultrasound. At the end of 12 weeks, all the baby's organs are formed. Prenatal care is all the medical care you receive before the birth of your baby. Make sure you get good prenatal care and follow all of your doctor's instructions. Follow these instructions at home: Medicines  Take over-the-counter and prescription medicines only as told by your doctor. Some medicines are safe and some medicines are not safe during pregnancy.  Take a prenatal vitamin that contains at least 600 micrograms (mcg) of folic acid.  If you have trouble pooping (constipation), take medicine that will make your stool soft (stool softener) if your doctor approves. Eating and drinking   Eat regular, healthy meals.  Your doctor will tell you the  amount of weight gain that is right for you.  Avoid raw meat and uncooked cheese.  If you feel sick to your stomach (nauseous) or throw up (vomit): ? Eat 4 or 5 small meals a day instead of 3 large meals. ? Try eating a few soda crackers. ? Drink liquids between meals instead of during meals.  To prevent constipation: ? Eat foods that are high in fiber, like fresh fruits and vegetables, whole grains, and beans. ? Drink enough fluids to keep your pee (urine) clear or pale yellow. Activity  Exercise only as told by your doctor. Stop exercising if you have cramps or pain in your lower belly (abdomen) or low back.  Do not exercise if it is too hot, too humid, or if you are in a place of great height (high altitude).  Try to avoid standing for long periods of time. Move your legs often if you must stand in one place for a long time.  Avoid heavy lifting.  Wear low-heeled shoes. Sit and stand up straight.  You can have sex unless your doctor tells you not to. Relieving pain and discomfort  Wear a good support bra if your breasts are sore.  Take warm water baths (sitz baths) to soothe pain or discomfort caused by hemorrhoids. Use  hemorrhoid cream if your doctor says it is okay.  Rest with your legs raised if you have leg cramps or low back pain.  If you have puffy, bulging veins (varicose veins) in your legs: ? Wear support hose or compression stockings as told by your doctor. ? Raise (elevate) your feet for 15 minutes, 3-4 times a day. ? Limit salt in your food. Prenatal care  Schedule your prenatal visits by the twelfth week of pregnancy.  Write down your questions. Take them to your prenatal visits.  Keep all your prenatal visits as told by your doctor. This is important. Safety  Wear your seat belt at all times when driving.  Make a list of emergency phone numbers. The list should include numbers for family, friends, the hospital, and police and fire departments. General instructions  Ask your doctor for a referral to a local prenatal class. Begin classes no later than at the start of month 6 of your pregnancy.  Ask for help if you need counseling or if you need help with nutrition. Your doctor can give you advice or tell you where to go for help.  Do not use hot tubs, steam rooms, or saunas.  Do not douche or use tampons or scented sanitary pads.  Do not cross your legs for long periods of time.  Avoid all herbs and alcohol. Avoid drugs that are not approved by your doctor.  Do not use any tobacco products, including cigarettes, chewing tobacco, and electronic cigarettes. If you need help quitting, ask your doctor. You may get counseling or other support to help you quit.  Avoid cat litter boxes and soil used by cats. These carry germs that can cause birth defects in the baby and can cause a loss of your baby (miscarriage) or stillbirth.  Visit your dentist. At home, brush your teeth with a soft toothbrush. Be gentle when you floss. Contact a doctor if:  You are dizzy.  You have mild cramps or pressure in your lower belly.  You have a nagging pain in your belly area.  You continue to feel  sick to your stomach, you throw up, or you have watery poop (diarrhea).  You have a  bad smelling fluid coming from your vagina.  You have pain when you pee (urinate).  You have increased puffiness (swelling) in your face, hands, legs, or ankles. Get help right away if:  You have a fever.  You are leaking fluid from your vagina.  You have spotting or bleeding from your vagina.  You have very bad belly cramping or pain.  You gain or lose weight rapidly.  You throw up blood. It may look like coffee grounds.  You are around people who have [place] measles, fifth disease, or chickenpox.  You have a very bad headache.  You have shortness of breath.  You have any kind of trauma, such as from a fall or a car accident. Summary  The first trimester of pregnancy is from week 1 until the end of week 13 (months 1 through 3).  To take care of yourself and your unborn baby, you will need to eat healthy meals, take medicines only if your doctor tells you to do so, and do activities that are safe for you and your baby.  Keep all follow-up visits as told by your doctor. This is important as your doctor will have to ensure that your baby is healthy and growing well. This information is not intended to replace advice given to you by your health care provider. Make sure you discuss any questions you have with your health care provider. Document Released: 05/27/2008 Document Revised: 04/01/2019 Document Reviewed: 12/17/2016 Elsevier Patient Education  2020 [employer].

## 2019-09-20 NOTE — Progress Notes (Signed)
GYN ENCOUNTER NOTE  Subjective:       Rachel Waters is a 23 y.o. G43P0020 female jere for pregnancy confirmation.   Reports missed menses and positive home pregnancy test. Only symptoms is occasional abdominal cramping.   Taking prenatal vitamin with folic acid and DHA. History significant for miscarriage earlier this year.   Denies difficulty breathing or respiratory distress, chest pain, dysuria, and leg pain or swelling.    Gynecologic History  Patient's last menstrual period was 07/28/2019 (exact date). Period Cycle (Days): 28 Period Duration (Days): 5 Period Pattern: Regular Menstrual Flow: Moderate Menstrual Control: Maxi pad Dysmenorrhea: (!) Moderate Dysmenorrhea Symptoms: Cramping  Gestational age: 53 weeks 5 days  Estimated date of birth: 05/03/2018  Contraception: none  Last Pap: due.   Obstetric History  OB History  Gravida Para Term Preterm AB Living  3 0 0 0 2 0  SAB TAB Ectopic Multiple Live Births  1 1 0 0 0    # Outcome Date GA Lbr Len/2nd Weight Sex Delivery Anes PTL Lv  3 Current           2 SAB 04/23/19          1 TAB 2018            Current Outpatient Medications on File Prior to Visit  Medication Sig Dispense Refill  . Prenatal Vit-Fe Fumarate-FA (MULTIVITAMIN-PRENATAL) 27-0.8 MG TABS tablet Take 1 tablet by mouth daily at 12 noon.     No current facility-administered medications on file prior to visit.     No Known Allergies  Social History   Socioeconomic History  . Marital status: Single    Spouse name: Not on file  . Number of children: Not on file  . Years of education: Not on file  . Highest education level: Not on file  Occupational History  . Not on file  Social Needs  . Financial resource strain: Not on file  . Food insecurity    Worry: Not on file    Inability: Not on file  . Transportation needs    Medical: Not on file    Non-medical: Not on file  Tobacco Use  . Smoking status: Never Smoker  . Smokeless tobacco:  Never Used  Substance and Sexual Activity  . Alcohol use: Not Currently    Comment: occasional   . Drug use: Never  . Sexual activity: Yes    Birth control/protection: None  Lifestyle  . Physical activity    Days per week: Not on file    Minutes per session: Not on file  . Stress: Not on file  Relationships  . Social Herbalist on phone: Not on file    Gets together: Not on file    Attends religious service: Not on file    Active member of club or organization: Not on file    Attends meetings of clubs or organizations: Not on file    Relationship status: Not on file  . Intimate partner violence    Fear of current or ex partner: Not on file    Emotionally abused: Not on file    Physically abused: Not on file    Forced sexual activity: Not on file  Other Topics Concern  . Not on file  Social History Narrative  . Not on file    Family History  Problem Relation Age of Onset  . Breast cancer Neg Hx   . Ovarian cancer Neg Hx   . Colon  cancer Neg Hx     The following portions of the patient's history were reviewed and updated as appropriate: allergies, current medications, past family history, past medical history, past social history, past surgical history and problem list.  Review of Systems  ROS negative except as noted above. Information obtained from patient.   Objective:   BP 110/65   Pulse 79   Ht 5\' 1"  (1.549 m)   Wt 107 lb 6.4 oz (48.7 kg)   LMP 07/28/2019 (Exact Date)   BMI 20.29 kg/m    CONSTITUTIONAL: Well-developed, well-nourished female in no acute distress.   PHYSICAL EXAM: Not indicated.   Recent Results (from the past 2160 hour(s))  POCT urine pregnancy     Status: Abnormal   Collection Time: 09/20/19  9:57 AM  Result Value Ref Range   Preg Test, Ur Positive (A) Negative    Assessment:   1. Missed menses  - POCT urine pregnancy - Beta hCG quant (ref lab) - Progesterone  2. History of miscarriage  - Beta hCG quant (ref  lab) - Progesterone   Plan:   Labs today, see orders.   First trimester education, see AVS.   Reviewed red flag symptoms and when to call.   RTC x 1-2 weeks for dating/viability ultrasound and intake.   RTC x 4-5 weeks for NOB PE or sooner if needed.    09/22/19, CNM [facility], [facility] 09/20/19 11:08 AM

## 2019-09-20 NOTE — Progress Notes (Signed)
Patient here for pregnancy confirmation, no complaints.  

## 2019-09-21 LAB — BETA HCG QUANT (REF LAB): hCG Quant: 13353 m[IU]/mL

## 2019-09-21 LAB — PROGESTERONE: Progesterone: 18.7 ng/mL

## 2019-09-30 ENCOUNTER — Ambulatory Visit (INDEPENDENT_AMBULATORY_CARE_PROVIDER_SITE_OTHER): Payer: Commercial Managed Care - PPO

## 2019-09-30 ENCOUNTER — Other Ambulatory Visit: Payer: Self-pay | Admitting: Certified Nurse Midwife

## 2019-09-30 ENCOUNTER — Other Ambulatory Visit: Payer: Self-pay

## 2019-09-30 ENCOUNTER — Ambulatory Visit (INDEPENDENT_AMBULATORY_CARE_PROVIDER_SITE_OTHER): Payer: Commercial Managed Care - PPO | Admitting: Certified Nurse Midwife

## 2019-09-30 VITALS — BP 109/69 | HR 72 | Ht 61.0 in | Wt 106.4 lb

## 2019-09-30 DIAGNOSIS — Z3481 Encounter for supervision of other normal pregnancy, first trimester: Secondary | ICD-10-CM

## 2019-09-30 DIAGNOSIS — Z113 Encounter for screening for infections with a predominantly sexual mode of transmission: Secondary | ICD-10-CM

## 2019-09-30 DIAGNOSIS — Z3491 Encounter for supervision of normal pregnancy, unspecified, first trimester: Secondary | ICD-10-CM

## 2019-09-30 LAB — OB RESULTS CONSOLE VARICELLA ZOSTER ANTIBODY, IGG: Varicella: IMMUNE

## 2019-09-30 LAB — OB RESULTS CONSOLE GC/CHLAMYDIA: Gonorrhea: NEGATIVE

## 2019-09-30 NOTE — Progress Notes (Signed)
I have reviewed the record and concur with patient management and plan of care.    Diona Fanti, CNM [facility], [facility] 09/30/19 4:32 PM

## 2019-09-30 NOTE — Progress Notes (Signed)
Rachel Waters presents for NOB nurse interview visit. Pregnancy confirmation done  09/20/19 with JML.  G-3 .  P-0020 . Dating scan today with EDD- 05/17/2020. 7 w 1/7.  Pregnancy education material explained and given. __0_ cats in the home. NOB labs ordered. HIV labs and Drug screen were explained. Drug screen ordered. PNV encouraged. Mild nausea- no meds needed. Pt states she has had a pap in 2019 at her pcp??? Needs flu vaccine. Unsure of last TDAP. Genetic screening options discussed. Genetic testing: Unsure.  Pt may discuss with provider. FMLA form signed and explained. Financial policy reviewed. Pt. To follow up with provider in _5_ weeks for NOB physical.  All questions answered.

## 2019-09-30 NOTE — Patient Instructions (Signed)
WHAT OB PATIENTS CAN EXPECT   Confirmation of pregnancy and ultrasound ordered if medically indicated-[redacted] weeks gestation  New OB (NOB) intake with nurse and New OB (NOB) labs- [redacted] weeks gestation  New OB (NOB) physical examination with provider- 11/[redacted] weeks gestation  Flu vaccine-[redacted] weeks gestation  Anatomy scan-[redacted] weeks gestation  Glucose tolerance test, blood work to test for anemia, T-dap vaccine-[redacted] weeks gestation  Vaginal swabs/cultures-STD/Group B strep-[redacted] weeks gestation  Appointments every 4 weeks until 28 weeks  Every 2 weeks from 28 weeks until 36 weeks  Weekly visits from 36 weeks until delivery  Morning Sickness  Morning sickness is when you feel sick to your stomach (nauseous) during pregnancy. You may feel sick to your stomach and throw up (vomit). You may feel sick in the morning, but you can feel this way at any time of day. Some women feel very sick to their stomach and cannot stop throwing up (hyperemesis gravidarum). Follow these instructions at home: Medicines  Take over-the-counter and prescription medicines only as told by your doctor. Do not take any medicines until you talk with your doctor about them first.  Taking multivitamins before getting pregnant can stop or lessen the harshness of morning sickness. Eating and drinking  Eat dry toast or crackers before getting out of bed.  Eat 5 or 6 small meals a day.  Eat dry and bland foods like rice and baked potatoes.  Do not eat greasy, fatty, or spicy foods.  Have someone cook for you if the smell of food causes you to feel sick or throw up.  If you feel sick to your stomach after taking prenatal vitamins, take them at night or with a snack.  Eat protein when you need a snack. Nuts, yogurt, and cheese are good choices.  Drink fluids throughout the day.  Try ginger ale made with real ginger, ginger tea made from fresh grated ginger, or ginger candies. General instructions  Do not use any products  that have nicotine or tobacco in them, such as cigarettes and e-cigarettes. If you need help quitting, ask your doctor.  Use an air purifier to keep the air in your house free of smells.  Get lots of fresh air.  Try to avoid smells that make you feel sick.  Try: ? Wearing a bracelet that is used for seasickness (acupressure wristband). ? Going to a doctor who puts thin needles into certain body points (acupuncture) to improve how you feel. Contact a doctor if:  You need medicine to feel better.  You feel dizzy or light-headed.  You are losing weight. Get help right away if:  You feel very sick to your stomach and cannot stop throwing up.  You pass out (faint).  You have very bad pain in your belly. Summary  Morning sickness is when you feel sick to your stomach (nauseous) during pregnancy.  You may feel sick in the morning, but you can feel this way at any time of day.  Making some changes to what you eat may help your symptoms go away. This information is not intended to replace advice given to you by your health care provider. Make sure you discuss any questions you have with your health care provider. Document Released: 01/16/2005 Document Revised: 11/21/2017 Document Reviewed: 01/09/2017 Elsevier Patient Education  2020 [employer]. How a Baby Grows During Pregnancy  Pregnancy begins when a female's sperm enters a female's egg (fertilization). Fertilization usually happens in one of the tubes (fallopian tubes) that connect the  ovaries to the womb (uterus). The fertilized egg moves down the fallopian tube to the uterus. Once it reaches the uterus, it implants into the lining of the uterus and begins to grow. For the first 10 weeks, the fertilized egg is called an embryo. After 10 weeks, it is called a fetus. As the fetus continues to grow, it receives oxygen and nutrients through tissue (placenta) that grows to support the developing baby. The placenta is the life support  system for the baby. It provides oxygen and nutrition and removes waste. Learning as much as you can about your pregnancy and how your baby is developing can help you enjoy the experience. It can also make you aware of when there might be a problem and when to ask questions. How long does a typical pregnancy last? A pregnancy usually lasts 280 days, or about 40 weeks. Pregnancy is divided into three periods of growth, also called trimesters:  First trimester: 0-12 weeks.  Second trimester: 13-27 weeks.  Third trimester: 28-40 weeks. The day when your baby is ready to be born (full term) is your estimated date of delivery. How does my baby develop month by month? First month  The fertilized egg attaches to the inside of the uterus.  Some cells will form the placenta. Others will form the fetus.  The arms, legs, brain, spinal cord, lungs, and heart begin to develop.  At the end of the first month, the heart begins to beat. Second month  The bones, inner ear, eyelids, hands, and feet form.  The genitals develop.  By the end of 8 weeks, all major organs are developing. Third month  All of the internal organs are forming.  Teeth develop below the gums.  Bones and muscles begin to grow. The spine can flex.  The skin is transparent.  Fingernails and toenails begin to form.  Arms and legs continue to grow longer, and hands and feet develop.  The fetus is about 3 inches (7.6 cm) long. Fourth month  The placenta is completely formed.  The external sex organs, neck, outer ear, eyebrows, eyelids, and fingernails are formed.  The fetus can hear, swallow, and move its arms and legs.  The kidneys begin to produce urine.  The skin is covered with a white, waxy coating (vernix) and very fine hair (lanugo). Fifth month  The fetus moves around more and can be felt for the first time (quickening).  The fetus starts to sleep and wake up and may begin to suck its finger.  The  nails grow to the end of the fingers.  The organ in the digestive system that makes bile (gallbladder) functions and helps to digest nutrients.  If your baby is a girl, eggs are present in her ovaries. If your baby is a boy, testicles start to move down into his scrotum. Sixth month  The lungs are formed.  The eyes open. The brain continues to develop.  Your baby has fingerprints and toe prints. Your baby's hair grows thicker.  At the end of the second trimester, the fetus is about 9 inches (22.9 cm) long. Seventh month  The fetus kicks and stretches.  The eyes are developed enough to sense changes in light.  The hands can make a grasping motion.  The fetus responds to sound. Eighth month  All organs and body systems are fully developed and functioning.  Bones harden, and taste buds develop. The fetus may hiccup.  Certain areas of the brain are still developing. The  skull remains soft. Ninth month  The fetus gains about  lb (0.23 kg) each week.  The lungs are fully developed.  Patterns of sleep develop.  The fetus's head typically moves into a head-down position (vertex) in the uterus to prepare for birth.  The fetus weighs 6-9 lb (2.72-4.08 kg) and is 19-20 inches (48.26-50.8 cm) long. What can I do to have a healthy pregnancy and help my baby develop? General instructions  Take prenatal vitamins as directed by your health care provider. These include vitamins such as folic acid, iron, calcium, and vitamin D. They are important for healthy development.  Take medicines only as directed by your health care provider. Read labels and ask a pharmacist or your health care provider whether over-the-counter medicines, supplements, and prescription drugs are safe to take during pregnancy.  Keep all follow-up visits as directed by your health care provider. This is important. Follow-up visits include prenatal care and screening tests. How do I know if my baby is developing  well? At each prenatal visit, your health care provider will do several different tests to check on your health and keep track of your baby's development. These include:  Fundal height and position. ? Your health care provider will measure your growing belly from your pubic bone to the top of the uterus using a tape measure. ? Your health care provider will also feel your belly to determine your baby's position.  Heartbeat. ? An ultrasound in the first trimester can confirm pregnancy and show a heartbeat, depending on how far along you are. ? Your health care provider will check your baby's heart rate at every prenatal visit.  Second trimester ultrasound. ? This ultrasound checks your baby's development. It also may show your baby's gender. What should I do if I have concerns about my baby's development? Always talk with your health care provider about any concerns that you may have about your pregnancy and your baby. Summary  A pregnancy usually lasts 280 days, or about 40 weeks. Pregnancy is divided into three periods of growth, also called trimesters.  Your health care provider will monitor your baby's growth and development throughout your pregnancy.  Follow your health care provider's recommendations about taking prenatal vitamins and medicines during your pregnancy.  Talk with your health care provider if you have any concerns about your pregnancy or your developing baby. This information is not intended to replace advice given to you by your health care provider. Make sure you discuss any questions you have with your health care provider. Document Released: 05/27/2008 Document Revised: 04/01/2019 Document Reviewed: 10/22/2017 Elsevier Patient Education  2020 [employer]. [facility]  [place], [place], [place] [address]  Phone: [phone]   [facility] (second location)  [address] [place], [place]  [address]  Phone: [phone]   [facility]) [place], [place], [place] [address] Phone: [phone]   [facility] [address] [place], [place] [address]  Phone: [phone] Trimester of Pregnancy The first trimester of pregnancy is from week 1 until the end of week 13 (months 1 through 3). A week after a sperm fertilizes an egg, the egg will implant on the wall of the uterus. This embryo will begin to develop into a baby. Genes from you and your partner will form the baby. The female genes will determine whether the baby will be a boy or a girl. At 6-8 weeks, the eyes  and face will be formed, and the heartbeat can be seen on ultrasound. At the end of 12 weeks, all the baby's organs will be formed. Now that you are pregnant, you will want to do everything you can to have a healthy baby. Two of the most important things are to get good prenatal care and to follow your health care provider's instructions. Prenatal care is all the medical care you receive before the baby's birth. This care will help prevent, find, and treat any problems during the pregnancy and childbirth. Body changes during your first trimester Your body goes through many changes during pregnancy. The changes vary from woman to woman.  You may gain or lose a couple of pounds at first.  You may feel sick to your stomach (nauseous) and you may throw up (vomit). If the vomiting is uncontrollable, call your health care provider.  You may tire easily.  You may develop headaches that can be relieved by medicines. All medicines should be approved by your health care provider.  You may urinate more often. Painful urination may mean you have a bladder infection.  You may develop heartburn as a result of your pregnancy.  You may develop constipation because certain hormones are causing the muscles that push stool through your intestines to slow down.  You may develop hemorrhoids or  swollen veins (varicose veins).  Your breasts may begin to grow larger and become tender. Your nipples may stick out more, and the tissue that surrounds them (areola) may become darker.  Your gums may bleed and may be sensitive to brushing and flossing.  Dark spots or blotches (chloasma, mask of pregnancy) may develop on your face. This will likely fade after the baby is born.  Your menstrual periods will stop.  You may have a loss of appetite.  You may develop cravings for certain kinds of food.  You may have changes in your emotions from day to day, such as being excited to be pregnant or being concerned that something may go wrong with the pregnancy and baby.  You may have more vivid and strange dreams.  You may have changes in your hair. These can include thickening of your hair, rapid growth, and changes in texture. Some women also have hair loss during or after pregnancy, or hair that feels dry or thin. Your hair will most likely return to normal after your baby is born. What to expect at prenatal visits During a routine prenatal visit:  You will be weighed to make sure you and the baby are growing normally.  Your blood pressure will be taken.  Your abdomen will be measured to track your baby's growth.  The fetal heartbeat will be listened to between weeks 10 and 14 of your pregnancy.  Test results from any previous visits will be discussed. Your health care provider may ask you:  How you are feeling.  If you are feeling the baby move.  If you have had any abnormal symptoms, such as leaking fluid, bleeding, severe headaches, or abdominal cramping.  If you are using any tobacco products, including cigarettes, chewing tobacco, and electronic cigarettes.  If you have any questions. Other tests that may be performed during your first trimester include:  Blood tests to find your blood type and to check for the presence of any previous infections. The tests will also be  used to check for low iron levels (anemia) and protein on red blood cells (Rh antibodies). Depending on your risk factors, or if  you previously had diabetes during pregnancy, you may have tests to check for high blood sugar that affects pregnant women (gestational diabetes).  Urine tests to check for infections, diabetes, or protein in the urine.  An ultrasound to confirm the proper growth and development of the baby.  Fetal screens for spinal cord problems (spina bifida) and Down syndrome.  HIV (human immunodeficiency virus) testing. Routine prenatal testing includes screening for HIV, unless you choose not to have this test.  You may need other tests to make sure you and the baby are doing well. Follow these instructions at home: Medicines  Follow your health care provider's instructions regarding medicine use. Specific medicines may be either safe or unsafe to take during pregnancy.  Take a prenatal vitamin that contains at least 600 micrograms (mcg) of folic acid.  If you develop constipation, try taking a stool softener if your health care provider approves. Eating and drinking   Eat a balanced diet that includes fresh fruits and vegetables, whole grains, good sources of protein such as meat, eggs, or tofu, and low-fat dairy. Your health care provider will help you determine the amount of weight gain that is right for you.  Avoid raw meat and uncooked cheese. These carry germs that can cause birth defects in the baby.  Eating four or five small meals rather than three large meals a day may help relieve nausea and vomiting. If you start to feel nauseous, eating a few soda crackers can be helpful. Drinking liquids between meals, instead of during meals, also seems to help ease nausea and vomiting.  Limit foods that are high in fat and processed sugars, such as fried and sweet foods.  To prevent constipation: ? Eat foods that are high in fiber, such as fresh fruits and vegetables,  whole grains, and beans. ? Drink enough fluid to keep your urine clear or pale yellow. Activity  Exercise only as directed by your health care provider. Most women can continue their usual exercise routine during pregnancy. Try to exercise for 30 minutes at least 5 days a week. Exercising will help you: ? Control your weight. ? Stay in shape. ? Be prepared for labor and delivery.  Experiencing pain or cramping in the lower abdomen or lower back is a good sign that you should stop exercising. Check with your health care provider before continuing with normal exercises.  Try to avoid standing for long periods of time. Move your legs often if you must stand in one place for a long time.  Avoid heavy lifting.  Wear low-heeled shoes and practice good posture.  You may continue to have sex unless your health care provider tells you not to. Relieving pain and discomfort  Wear a good support bra to relieve breast tenderness.  Take warm sitz baths to soothe any pain or discomfort caused by hemorrhoids. Use hemorrhoid cream if your health care provider approves.  Rest with your legs elevated if you have leg cramps or low back pain.  If you develop varicose veins in your legs, wear support hose. Elevate your feet for 15 minutes, 3-4 times a day. Limit salt in your diet. Prenatal care  Schedule your prenatal visits by the twelfth week of pregnancy. They are usually scheduled monthly at first, then more often in the last 2 months before delivery.  Write down your questions. Take them to your prenatal visits.  Keep all your prenatal visits as told by your health care provider. This is important. Safety  Wear  your seat belt at all times when driving.  Make a list of emergency phone numbers, including numbers for family, friends, the hospital, and police and fire departments. General instructions  Ask your health care provider for a referral to a local prenatal education class. Begin classes  no later than the beginning of month 6 of your pregnancy.  Ask for help if you have counseling or nutritional needs during pregnancy. Your health care provider can offer advice or refer you to specialists for help with various needs.  Do not use hot tubs, steam rooms, or saunas.  Do not douche or use tampons or scented sanitary pads.  Do not cross your legs for long periods of time.  Avoid cat litter boxes and soil used by cats. These carry germs that can cause birth defects in the baby and possibly loss of the fetus by miscarriage or stillbirth.  Avoid all smoking, herbs, alcohol, and medicines not prescribed by your health care provider. Chemicals in these products affect the formation and growth of the baby.  Do not use any products that contain nicotine or tobacco, such as cigarettes and e-cigarettes. If you need help quitting, ask your health care provider. You may receive counseling support and other resources to help you quit.  Schedule a dentist appointment. At home, brush your teeth with a soft toothbrush and be gentle when you floss. Contact a health care provider if:  You have dizziness.  You have mild pelvic cramps, pelvic pressure, or nagging pain in the abdominal area.  You have persistent nausea, vomiting, or diarrhea.  You have a bad smelling vaginal discharge.  You have pain when you urinate.  You notice increased swelling in your face, hands, legs, or ankles.  You are exposed to fifth disease or chickenpox.  You are exposed to [place] measles (rubella) and have never had it. Get help right away if:  You have a fever.  You are leaking fluid from your vagina.  You have spotting or bleeding from your vagina.  You have severe abdominal cramping or pain.  You have rapid weight gain or loss.  You vomit blood or material that looks like coffee grounds.  You develop a severe headache.  You have shortness of breath.  You have any kind of trauma, such as  from a fall or a car accident. Summary  The first trimester of pregnancy is from week 1 until the end of week 13 (months 1 through 3).  Your body goes through many changes during pregnancy. The changes vary from woman to woman.  You will have routine prenatal visits. During those visits, your health care provider will examine you, discuss any test results you may have, and talk with you about how you are feeling. This information is not intended to replace advice given to you by your health care provider. Make sure you discuss any questions you have with your health care provider. Document Released: 12/03/2001 Document Revised: 11/21/2017 Document Reviewed: 11/20/2016 Elsevier Patient Education  2020 [employer]. Commonly Asked Questions During Pregnancy  Cats: A parasite can be excreted in cat feces.  To avoid exposure you need to have another person empty the little box.  If you must empty the litter box you will need to wear gloves.  Wash your hands after handling your cat.  This parasite can also be found in raw or undercooked meat so this should also be avoided.  Colds, Sore Throats, Flu: Please check your medication sheet to see what you  can take for symptoms.  If your symptoms are unrelieved by these medications please call the office.  Dental Work: Most any dental work [employer] corporate recommends is permitted.  X-rays should only be taken during the first trimester if absolutely necessary.  Your abdomen should be shielded with a lead apron during all x-rays.  Please notify your provider prior to receiving any x-rays.  Novocaine is fine; gas is not recommended.  If your dentist requires a note from [place] prior to dental work please call the office and we will provide one for you.  Exercise: Exercise is an important part of staying healthy during your pregnancy.  You may continue most exercises you were accustomed to prior to pregnancy.  Later in your pregnancy you will most likely notice you  have difficulty with activities requiring balance like riding a bicycle.  It is important that you listen to your body and avoid activities that put you at a higher risk of falling.  Adequate rest and staying well hydrated are a must!  If you have questions about the safety of specific activities ask your provider.    Exposure to Children with illness: Try to avoid obvious exposure; report any symptoms to [place] when noted,  If you have chicken pos, red measles or mumps, you should be immune to these diseases.   Please do not take any vaccines while pregnant unless you have checked with your OB provider.  Fetal Movement: After 28 weeks we recommend you do "kick counts" twice daily.  Lie or sit down in a calm quiet environment and count your baby movements "kicks".  You should feel your baby at least 10 times per hour.  If you have not felt 10 kicks within the first hour get up, walk around and have something sweet to eat or drink then repeat for an additional hour.  If count remains less than 10 per hour notify your provider.  Fumigating: Follow your pest control agent's advice as to how long to stay out of your home.  Ventilate the area well before re-entering.  Hemorrhoids:   Most over-the-counter preparations can be used during pregnancy.  Check your medication to see what is safe to use.  It is important to use a stool softener or fiber in your diet and to drink lots of liquids.  If hemorrhoids seem to be getting worse please call the office.   Hot Tubs:  Hot tubs Jacuzzis and saunas are not recommended while pregnant.  These increase your internal body temperature and should be avoided.  Intercourse:  Sexual intercourse is safe during pregnancy as long as you are comfortable, unless otherwise advised by your provider.  Spotting may occur after intercourse; report any bright red bleeding that is heavier than spotting.  Labor:  If you know that you are in labor, please go to the hospital.  If you are  unsure, please call the office and let us help you decide what to do.  Lifting, straining, etc:  If your job requires heavy lifting or straining please check with your provider for any limitations.  Generally, you should not lift items heavier than that you can lift simply with your hands and arms (no back muscles)  Painting:  Paint fumes do not harm your pregnancy, but may make you ill and should be avoided if possible.  Latex or water based paints have less odor than oils.  Use adequate ventilation while painting.  Permanents & Hair Color:  Chemicals in hair dyes are  not recommended as they cause increase hair dryness which can increase hair loss during pregnancy.  " Highlighting" and permanents are allowed.  Dye may be absorbed differently and permanents may not hold as well during pregnancy.  Sunbathing:  Use a sunscreen, as skin burns easily during pregnancy.  Drink plenty of fluids; avoid over heating.  Tanning Beds:  Because their possible side effects are still unknown, tanning beds are not recommended.  Ultrasound Scans:  Routine ultrasounds are performed at approximately 20 weeks.  You will be able to see your baby's general anatomy an if you would like to know the gender this can usually be determined as well.  If it is questionable when you conceived you may also receive an ultrasound early in your pregnancy for dating purposes.  Otherwise ultrasound exams are not routinely performed unless there is a medical necessity.  Although you can request a scan we ask that you pay for it when conducted because insurance does not cover " patient request" scans.  Work: If your pregnancy proceeds without complications you may work until your due date, unless your physician or employer advises otherwise.  Round Ligament Pain/Pelvic Discomfort:  Sharp, shooting pains not associated with bleeding are fairly common, usually occurring in the second trimester of pregnancy.  They tend to be worse when  standing up or when you remain standing for long periods of time.  These are the result of pressure of certain pelvic ligaments called "round ligaments".  Rest, Tylenol and heat seem to be the most effective relief.  As the womb and fetus grow, they rise out of the pelvis and the discomfort improves.  Please notify the office if your pain seems different than that described.  It may represent a more serious condition.  Common Medications Safe in Pregnancy  Acne:      Constipation:  Benzoyl Peroxide     Colace  Clindamycin      Dulcolax Suppository  Topica Erythromycin     Fibercon  Salicylic Acid      Metamucil         Miralax AVOID:        Senakot   Accutane    Cough:  Retin-A       Cough Drops  Tetracycline      Phenergan w/ Codeine if Rx  Minocycline      Robitussin (Plain & DM)  Antibiotics:     Crabs/Lice:  Ceclor       RID  Cephalosporins    AVOID:  E-Mycins      Kwell  Keflex  Macrobid/Macrodantin   Diarrhea:  Penicillin      Kao-Pectate  Zithromax      Imodium AD         PUSH FLUIDS AVOID:       Cipro     Fever:  Tetracycline      Tylenol (Regular or Extra  Minocycline       Strength)  Levaquin      Extra Strength-Do not          Exceed 8 tabs/24 hrs Caffeine:        '200mg'$ /day (equiv. To 1 cup of coffee or  approx. 3 12 oz sodas)         Gas: Cold/Hayfever:       Gas-X  Benadryl      Mylicon  Claritin       Phazyme  **Claritin-D        Chlor-Trimeton    Headaches:  Dimetapp      ASA-Free Excedrin  Drixoral-Non-Drowsy     Cold Compress  Mucinex (Guaifenasin)     Tylenol (Regular or Extra  Sudafed/Sudafed-12 Hour     Strength)  **Sudafed PE Pseudoephedrine   Tylenol Cold & Sinus     Vicks Vapor Rub  Zyrtec  **AVOID if Problems With Blood Pressure         Heartburn: Avoid lying down for at least 1 hour after meals  Aciphex      Maalox     Rash:  Milk of Magnesia     Benadryl    Mylanta       1% Hydrocortisone Cream  Pepcid  Pepcid Complete   Sleep  Aids:  Prevacid      Ambien   Prilosec       Benadryl  Rolaids       Chamomile Tea  Tums (Limit 4/day)     Unisom  Zantac       Tylenol PM         Warm milk-add vanilla or  Hemorrhoids:       Sugar for taste  Anusol/Anusol H.C.  (RX: Analapram 2.5%)  Sugar Substitutes:  Hydrocortisone OTC     Ok in moderation  Preparation H      Tucks        Vaseline lotion applied to tissue with wiping    Herpes:     Throat:  Acyclovir      Oragel  Famvir  Valtrex     Vaccines:         Flu Shot Leg Cramps:       *Gardasil  Benadryl      Hepatitis A         Hepatitis B Nasal Spray:       Pneumovax  Saline Nasal Spray     Polio Booster         Tetanus Nausea:       Tuberculosis test or PPD  Vitamin B6 25 mg TID   AVOID:    Dramamine      *Gardasil  Emetrol       Live Poliovirus  Ginger Root 250 mg QID    MMR (measles, mumps &  High Complex Carbs @ Bedtime    rebella)  Sea Bands-Accupressure    Varicella (Chickenpox)  Unisom 1/2 tab TID     *No known complications           If received before Pain:         Known pregnancy;   Darvocet       Resume series after  Lortab        Delivery  Percocet    Yeast:   Tramadol      Femstat  Tylenol 3      Gyne-lotrimin  Ultram       Monistat  Vicodin           MISC:         All Sunscreens           Hair Coloring/highlights          Insect Repellant's          (Including DEET)         Mystic Tans

## 2019-10-01 LAB — MONITOR DRUG PROFILE 14(MW)
Amphetamine Scrn, Ur: NEGATIVE ng/mL
BARBITURATE SCREEN URINE: NEGATIVE ng/mL
BENZODIAZEPINE SCREEN, URINE: NEGATIVE ng/mL
Buprenorphine, Urine: NEGATIVE ng/mL
CANNABINOIDS UR QL SCN: NEGATIVE ng/mL
Cocaine (Metab) Scrn, Ur: NEGATIVE ng/mL
Creatinine(Crt), U: 25 mg/dL (ref 20.0–300.0)
Fentanyl, Urine: NEGATIVE pg/mL
Meperidine Screen, Urine: NEGATIVE ng/mL
Methadone Screen, Urine: NEGATIVE ng/mL
OXYCODONE+OXYMORPHONE UR QL SCN: NEGATIVE ng/mL
Opiate Scrn, Ur: NEGATIVE ng/mL
Ph of Urine: 6.3 (ref 4.5–8.9)
Phencyclidine Qn, Ur: NEGATIVE ng/mL
Propoxyphene Scrn, Ur: NEGATIVE ng/mL
SPECIFIC GRAVITY: 1.007
Tramadol Screen, Urine: NEGATIVE ng/mL

## 2019-10-01 LAB — URINALYSIS, ROUTINE W REFLEX MICROSCOPIC
Bilirubin, UA: NEGATIVE
Glucose, UA: NEGATIVE
Ketones, UA: NEGATIVE
Leukocytes,UA: NEGATIVE
Nitrite, UA: NEGATIVE
Protein,UA: NEGATIVE
RBC, UA: NEGATIVE
Specific Gravity, UA: 1.005 (ref 1.005–1.030)
Urobilinogen, Ur: 0.2 mg/dL (ref 0.2–1.0)
pH, UA: 6.5 (ref 5.0–7.5)

## 2019-10-01 LAB — ABO AND RH: Rh Factor: POSITIVE

## 2019-10-01 LAB — HGB SOLU + RFLX FRAC: Sickle Solubility Test - HGBRFX: NEGATIVE

## 2019-10-01 LAB — RUBELLA SCREEN: Rubella Antibodies, IGG: 3.36 index (ref >0.99)

## 2019-10-01 LAB — HIV ANTIBODY (ROUTINE TESTING W REFLEX): HIV Screen 4th Generation wRfx: NONREACTIVE

## 2019-10-01 LAB — RPR: RPR Ser Ql: NONREACTIVE

## 2019-10-01 LAB — NICOTINE SCREEN, URINE: Cotinine Ql Scrn, Ur: NEGATIVE ng/mL

## 2019-10-01 LAB — VARICELLA ZOSTER ANTIBODY, IGG: Varicella zoster IgG: 1106 index (ref >165)

## 2019-10-01 LAB — HEPATITIS B SURFACE ANTIGEN: Hepatitis B Surface Ag: NEGATIVE

## 2019-10-01 LAB — ANTIBODY SCREEN: Antibody Screen: NEGATIVE

## 2019-10-02 LAB — URINE CULTURE, OB REFLEX

## 2019-10-02 LAB — CULTURE, OB URINE

## 2019-10-02 LAB — GC/CHLAMYDIA PROBE AMP
Chlamydia trachomatis, NAA: NEGATIVE
Neisseria Gonorrhoeae by PCR: NEGATIVE

## 2019-10-03 ENCOUNTER — Encounter: Payer: Self-pay | Admitting: Certified Nurse Midwife

## 2019-10-18 ENCOUNTER — Encounter: Payer: Commercial Managed Care - PPO | Admitting: Certified Nurse Midwife

## 2019-10-18 ENCOUNTER — Other Ambulatory Visit (HOSPITAL_COMMUNITY)
Admission: RE | Admit: 2019-10-18 | Discharge: 2019-10-18 | Disposition: A | Discharge status: Home / Self Care | Payer: Commercial Managed Care - PPO | Source: Ambulatory Visit | Attending: Certified Nurse Midwife | Admitting: Certified Nurse Midwife

## 2019-10-18 ENCOUNTER — Ambulatory Visit (INDEPENDENT_AMBULATORY_CARE_PROVIDER_SITE_OTHER): Payer: Commercial Managed Care - PPO | Admitting: Certified Nurse Midwife

## 2019-10-18 ENCOUNTER — Other Ambulatory Visit: Payer: Self-pay

## 2019-10-18 VITALS — BP 99/65 | HR 65 | Wt 101.0 lb

## 2019-10-18 DIAGNOSIS — O09291 Supervision of pregnancy with other poor reproductive or obstetric history, first trimester: Secondary | ICD-10-CM

## 2019-10-18 DIAGNOSIS — O219 Vomiting of pregnancy, unspecified: Secondary | ICD-10-CM | POA: Diagnosis not present

## 2019-10-18 DIAGNOSIS — Z124 Encounter for screening for malignant neoplasm of cervix: Secondary | ICD-10-CM

## 2019-10-18 DIAGNOSIS — Z3491 Encounter for supervision of normal pregnancy, unspecified, first trimester: Secondary | ICD-10-CM | POA: Insufficient documentation

## 2019-10-18 DIAGNOSIS — Z8759 Personal history of other complications of pregnancy, childbirth and the puerperium: Secondary | ICD-10-CM

## 2019-10-18 DIAGNOSIS — Z3A09 9 weeks gestation of pregnancy: Secondary | ICD-10-CM | POA: Diagnosis not present

## 2019-10-18 LAB — POCT URINALYSIS DIPSTICK OB
Bilirubin, UA: NEGATIVE
Blood, UA: NEGATIVE
Glucose, UA: NEGATIVE
Ketones, UA: NEGATIVE
Nitrite, UA: NEGATIVE
POC,PROTEIN,UA: NEGATIVE
Spec Grav, UA: 1.02 (ref 1.010–1.025)
Urobilinogen, UA: 0.2 E.U./dL
pH, UA: 6 (ref 5.0–8.0)

## 2019-10-18 NOTE — Progress Notes (Signed)
Patient here for NOB physical.  Patient c/o feeling hungry but unable to eat, occasional vomiting.  Also c/o really bad daily headaches, not taking anything to help.  Wondering if it is ok to eat seafood cooked with lime.

## 2019-10-18 NOTE — Progress Notes (Signed)
NEW OB HISTORY AND PHYSICAL  SUBJECTIVE:       Rachel Waters is a 23 y.o. [id number] female, Patient's last menstrual period was 07/28/2019 (exact date)., Estimated Date of Delivery: 05/17/20, [redacted]w[redacted]d, presents today for establishment of Prenatal Care.  Endorses hunger, but inability to eat due to nausea with occasional vomiting and daily headaches. No using any home treatment measures.   Questions regarding dietary precautions. Unsure about genetic screening. Desires midwifery care.   History significant for previous miscarriages x two (2).   Denies difficulty breathing or respiratory distress, chest pain, abdominal pain, vaginal bleeding, dysuria, and leg pain or swelling.    Gynecologic History  Patient's last menstrual period was 07/28/2019 (exact date).  Period Cycle (Days): 28 Period Duration (Days): 5 Period Pattern: Regular Menstrual Flow: Moderate Menstrual Control: Maxi pad Dysmenorrhea: (!) Mild Dysmenorrhea Symptoms: Cramping  Contraception: none  Last Pap: due.   Obstetric History OB History  Gravida Para Term Preterm AB Living  3 0 0 0 2 0  SAB TAB Ectopic Multiple Live Births  2 0 0 0 0    # Outcome Date GA Lbr Len/2nd Weight Sex Delivery Anes PTL Lv  3 Current           2 SAB 04/23/19          1 SAB 2018            Current Outpatient Medications on File Prior to Visit  Medication Sig Dispense Refill  . Prenatal Vit-Fe Fumarate-FA (MULTIVITAMIN-PRENATAL) 27-0.8 MG TABS tablet Take 1 tablet by mouth daily at 12 noon.     No current facility-administered medications on file prior to visit.     No Known Allergies  Social History   Socioeconomic History  . Marital status: Single    Spouse name: Not on file  . Number of children: Not on file  . Years of education: Not on file  . Highest education level: Not on file  Occupational History  . Not on file  Social Needs  . Financial resource strain: Not on file  . Food insecurity    Worry: Not on file   Inability: Not on file  . Transportation needs    Medical: Not on file    Non-medical: Not on file  Tobacco Use  . Smoking status: Never Smoker  . Smokeless tobacco: Never Used  Substance and Sexual Activity  . Alcohol use: Not Currently    Comment: occasional   . Drug use: Never  . Sexual activity: Yes    Birth control/protection: None  Lifestyle  . Physical activity    Days per week: Not on file    Minutes per session: Not on file  . Stress: Not on file  Relationships  . Social Herbalist on phone: Not on file    Gets together: Not on file    Attends religious service: Not on file    Active member of club or organization: Not on file    Attends meetings of clubs or organizations: Not on file    Relationship status: Not on file  . Intimate partner violence    Fear of current or ex partner: Not on file    Emotionally abused: Not on file    Physically abused: Not on file    Forced sexual activity: Not on file  Other Topics Concern  . Not on file  Social History Narrative  . Not on file    Family History  Problem Relation  Age of Onset  . Breast cancer Neg Hx   . Ovarian cancer Neg Hx   . Colon cancer Neg Hx     The following portions of the patient's history were reviewed and updated as appropriate: allergies, current medications, past OB history, past medical history, past surgical history, past family history, past social history, and problem list.  Review of Systems:  ROS negative except as noted above. Information obtained from patient.   OBJECTIVE:  BP 99/65   Pulse 65   Wt 101 lb (45.8 kg)   LMP 07/28/2019 (Exact Date)   BMI 19.08 kg/m   Initial Physical Exam (New OB)  GENERAL APPEARANCE: alert, well appearing, in no apparent distress  HEAD: normocephalic, atraumatic  MOUTH: mucous membranes moist, pharynx normal without lesions  THYROID: no thyromegaly or masses present  BREASTS: no masses noted, no significant tenderness, no  palpable axillary nodes, no skin changes  LUNGS: clear to auscultation, no wheezes, rales or rhonchi, symmetric air entry  HEART: regular rate and rhythm, no murmurs  ABDOMEN: soft, nontender, nondistended, no abnormal masses, no epigastric pain, fundus not palpable and FHT present  EXTREMITIES: no redness or tenderness in the calves or thighs, no edema  SKIN: normal coloration and turgor, no rashes  LYMPH NODES: no adenopathy palpable  NEUROLOGIC: alert, oriented, normal speech, no focal findings or movement disorder noted  PELVIC EXAM EXTERNAL GENITALIA: normal appearing vulva with no masses, tenderness or lesions VAGINA: no abnormal discharge or lesions CERVIX: no lesions or cervical motion tenderness; pap collected UTERUS: gravid and consistent with 10 weeks ADNEXA: no masses palpable and nontender OB EXAM PELVIMETRY: appears adequate  ASSESSMENT: Normal pregnancy Rh positive Screening cervical cancer Prefers midwifery care Desires genetic screening  PLAN: Prenatal care New OB counseling: The patient has been given an overview regarding routine prenatal care. Recommendations regarding diet, weight gain, and exercise in pregnancy were given. Prenatal testing, optional genetic testing, and ultrasound use in pregnancy were reviewed.  Benefits of Breast Feeding were discussed. The patient is encouraged to consider nursing her baby post partum. See orders   Gunnar Bulla, CNM [facility], [facility] 10/18/19 3:27 PM

## 2019-10-18 NOTE — Patient Instructions (Addendum)
WHAT OB PATIENTS CAN EXPECT   Confirmation of pregnancy and ultrasound ordered if medically indicated-[redacted] weeks gestation  New OB (NOB) intake with nurse and New OB (NOB) labs- [redacted] weeks gestation  New OB (NOB) physical examination with provider- 11/[redacted] weeks gestation  Flu vaccine-[redacted] weeks gestation  Anatomy scan-[redacted] weeks gestation  Glucose tolerance test, blood work to test for anemia, T-dap vaccine-[redacted] weeks gestation  Vaginal swabs/cultures-STD/Group B strep-[redacted] weeks gestation  Appointments every 4 weeks until 28 weeks  Every 2 weeks from 28 weeks until 36 weeks  Weekly visits from 36 weeks until delivery    Back Pain in Pregnancy Back pain during pregnancy is common. Back pain may be caused by several factors that are related to changes during your pregnancy. Follow these instructions at home: Managing pain, stiffness, and swelling      If directed, for sudden (acute) back pain, put ice on the painful area. ? Put ice in a plastic bag. ? Place a towel between your skin and the bag. ? Leave the ice on for 20 minutes, 2-3 times per day.  If directed, apply heat to the affected area before you exercise. Use the heat source that your health care provider recommends, such as a moist heat pack or a heating pad. ? Place a towel between your skin and the heat source. ? Leave the heat on for 20-30 minutes. ? Remove the heat if your skin turns bright red. This is especially important if you are unable to feel pain, heat, or cold. You may have a greater risk of getting burned.  If directed, massage the affected area. Activity  Exercise as told by your health care provider. Gentle exercise is the best way to prevent or manage back pain.  Listen to your body when lifting. If lifting hurts, ask for help or bend your knees. This uses your leg muscles instead of your back muscles.  Squat down when picking up something from the floor. Do not bend over.  Only use bed rest for  short periods as told by your health care provider. Bed rest should only be used for the most severe episodes of back pain. Standing, sitting, and lying down  Do not stand in one place for long periods of time.  Use good posture when sitting. Make sure your head rests over your shoulders and is not hanging forward. Use a pillow on your lower back if necessary.  Try sleeping on your side, preferably the left side, with a pregnancy support pillow or 1-2 regular pillows between your legs. ? If you have back pain after a night's rest, your bed may be too soft. ? A firm mattress may provide more support for your back during pregnancy. General instructions  Do not wear high heels.  Eat a healthy diet. Try to gain weight within your health care provider's recommendations.  Use a maternity girdle, elastic sling, or back brace as told by your health care provider.  Take over-the-counter and prescription medicines only as told by your health care provider.  Work with a physical therapist or massage therapist to find ways to manage back pain. Acupuncture or massage therapy may be helpful.  Keep all follow-up visits as told by your health care provider. This is important. Contact a health care provider if:  Your back pain interferes with your daily activities.  You have increasing pain in other parts of your body. Get help right away if:  You develop numbness, tingling, weakness, or problems with  the use of your arms or legs.  You develop severe back pain that is not controlled with medicine.  You have a change in bowel or bladder control.  You develop shortness of breath, dizziness, or you faint.  You develop nausea, vomiting, or sweating.  You have back pain that is a rhythmic, cramping pain similar to labor pains. Labor pain is usually 1-2 minutes apart, lasts for about 1 minute, and involves a bearing down feeling or pressure in your pelvis.  You have back pain and your water breaks  or you have vaginal bleeding.  You have back pain or numbness that travels down your leg.  Your back pain developed after you fell.  You develop pain on one side of your back.  You see blood in your urine.  You develop skin blisters in the area of your back pain. Summary  Back pain may be caused by several factors that are related to changes during your pregnancy.  Follow instructions as told by your health care provider for managing pain, stiffness, and swelling.  Exercise as told by your health care provider. Gentle exercise is the best way to prevent or manage back pain.  Take over-the-counter and prescription medicines only as told by your health care provider.  Keep all follow-up visits as told by your health care provider. This is important. This information is not intended to replace advice given to you by your health care provider. Make sure you discuss any questions you have with your health care provider. Document Released: 03/19/2006 Document Revised: 03/30/2019 Document Reviewed: 05/27/2018 Elsevier Patient Education  [place].   Abdominal Pain During Pregnancy  Belly (abdominal) pain is common during pregnancy. There are many possible causes. Most of the time, it is not a serious problem. Other times, it can be a sign that something is wrong with the pregnancy. Always tell your doctor if you have belly pain. Follow these instructions at home:  Do not have sex or put anything in your vagina until your pain goes away completely.  Get plenty of rest until your pain gets better.  Drink enough fluid to keep your pee (urine) pale yellow.  Take over-the-counter and prescription medicines only as told by your doctor.  Keep all follow-up visits as told by your doctor. This is important. Contact a doctor if:  Your pain continues or gets worse after resting.  You have lower belly pain that: ? Comes and goes at regular times. ? Spreads to your back. ? Feels  like menstrual cramps.  You have pain or burning when you pee (urinate). Get help right away if:  You have a fever or chills.  You have vaginal bleeding.  You are leaking fluid from your vagina.  You are passing tissue from your vagina.  You throw up (vomit) for more than 24 hours.  You have watery poop (diarrhea) for more than 24 hours.  Your baby is moving less than usual.  You feel very weak or faint.  You have shortness of breath.  You have very bad pain in your upper belly. Summary  Belly (abdominal) pain is common during pregnancy. There are many possible causes.  If you have belly pain during pregnancy, tell your doctor right away.  Keep all follow-up visits as told by your doctor. This is important. This information is not intended to replace advice given to you by your health care provider. Make sure you discuss any questions you have with your health care provider. Document  Released: 11/27/2009 Document Revised: 03/29/2019 Document Reviewed: 03/13/2017 Elsevier Patient Education  2020 [employer].   How a Baby Grows During Pregnancy  Pregnancy begins when a female's sperm enters a female's egg (fertilization). Fertilization usually happens in one of the tubes (fallopian tubes) that connect the ovaries to the womb (uterus). The fertilized egg moves down the fallopian tube to the uterus. Once it reaches the uterus, it implants into the lining of the uterus and begins to grow. For the first 10 weeks, the fertilized egg is called an embryo. After 10 weeks, it is called a fetus. As the fetus continues to grow, it receives oxygen and nutrients through tissue (placenta) that grows to support the developing baby. The placenta is the life support system for the baby. It provides oxygen and nutrition and removes waste. Learning as much as you can about your pregnancy and how your baby is developing can help you enjoy the experience. It can also make you aware of when there  might be a problem and when to ask questions. How long does a typical pregnancy last? A pregnancy usually lasts 280 days, or about 40 weeks. Pregnancy is divided into three periods of growth, also called trimesters:  First trimester: 0-12 weeks.  Second trimester: 13-27 weeks.  Third trimester: 28-40 weeks. The day when your baby is ready to be born (full term) is your estimated date of delivery. How does my baby develop month by month? First month  The fertilized egg attaches to the inside of the uterus.  Some cells will form the placenta. Others will form the fetus.  The arms, legs, brain, spinal cord, lungs, and heart begin to develop.  At the end of the first month, the heart begins to beat. Second month  The bones, inner ear, eyelids, hands, and feet form.  The genitals develop.  By the end of 8 weeks, all major organs are developing. Third month  All of the internal organs are forming.  Teeth develop below the gums.  Bones and muscles begin to grow. The spine can flex.  The skin is transparent.  Fingernails and toenails begin to form.  Arms and legs continue to grow longer, and hands and feet develop.  The fetus is about 3 inches (7.6 cm) long. Fourth month  The placenta is completely formed.  The external sex organs, neck, outer ear, eyebrows, eyelids, and fingernails are formed.  The fetus can hear, swallow, and move its arms and legs.  The kidneys begin to produce urine.  The skin is covered with a white, waxy coating (vernix) and very fine hair (lanugo). Fifth month  The fetus moves around more and can be felt for the first time (quickening).  The fetus starts to sleep and wake up and may begin to suck its finger.  The nails grow to the end of the fingers.  The organ in the digestive system that makes bile (gallbladder) functions and helps to digest nutrients.  If your baby is a girl, eggs are present in her ovaries. If your baby is a boy,  testicles start to move down into his scrotum. Sixth month  The lungs are formed.  The eyes open. The brain continues to develop.  Your baby has fingerprints and toe prints. Your baby's hair grows thicker.  At the end of the second trimester, the fetus is about 9 inches (22.9 cm) long. Seventh month  The fetus kicks and stretches.  The eyes are developed enough to sense changes in light.  The hands can make a grasping motion.  The fetus responds to sound. Eighth month  All organs and body systems are fully developed and functioning.  Bones harden, and taste buds develop. The fetus may hiccup.  Certain areas of the brain are still developing. The skull remains soft. Ninth month  The fetus gains about  lb (0.23 kg) each week.  The lungs are fully developed.  Patterns of sleep develop.  The fetus's head typically moves into a head-down position (vertex) in the uterus to prepare for birth.  The fetus weighs 6-9 lb (2.72-4.08 kg) and is 19-20 inches (48.26-50.8 cm) long. What can I do to have a healthy pregnancy and help my baby develop? General instructions  Take prenatal vitamins as directed by your health care provider. These include vitamins such as folic acid, iron, calcium, and vitamin D. They are important for healthy development.  Take medicines only as directed by your health care provider. Read labels and ask a pharmacist or your health care provider whether over-the-counter medicines, supplements, and prescription drugs are safe to take during pregnancy.  Keep all follow-up visits as directed by your health care provider. This is important. Follow-up visits include prenatal care and screening tests. How do I know if my baby is developing well? At each prenatal visit, your health care provider will do several different tests to check on your health and keep track of your baby's development. These include:  Fundal height and position. ? Your health care provider  will measure your growing belly from your pubic bone to the top of the uterus using a tape measure. ? Your health care provider will also feel your belly to determine your baby's position.  Heartbeat. ? An ultrasound in the first trimester can confirm pregnancy and show a heartbeat, depending on how far along you are. ? Your health care provider will check your baby's heart rate at every prenatal visit.  Second trimester ultrasound. ? This ultrasound checks your baby's development. It also may show your baby's gender. What should I do if I have concerns about my baby's development? Always talk with your health care provider about any concerns that you may have about your pregnancy and your baby. Summary  A pregnancy usually lasts 280 days, or about 40 weeks. Pregnancy is divided into three periods of growth, also called trimesters.  Your health care provider will monitor your baby's growth and development throughout your pregnancy.  Follow your health care provider's recommendations about taking prenatal vitamins and medicines during your pregnancy.  Talk with your health care provider if you have any concerns about your pregnancy or your developing baby. This information is not intended to replace advice given to you by your health care provider. Make sure you discuss any questions you have with your health care provider. Document Released: 05/27/2008 Document Revised: 04/01/2019 Document Reviewed: 10/22/2017 Elsevier Patient Education  2020 [place] for Pregnant Women While you are pregnant, your body requires additional nutrition to help support your growing baby. You also have a higher need for some vitamins and minerals, such as folic acid, calcium, iron, and vitamin D. Eating a healthy, well-balanced diet is very important for your health and your baby's health. Your need for extra calories varies for the three 77-monthsegments of your pregnancy (trimesters). For  most women, it is recommended to consume:  150 extra calories a day during the first trimester.  300 extra calories a day during the second trimester.  [place]  extra calories a day during the third trimester. What are tips for following this plan?   Do not try to lose weight or go on a diet during pregnancy.  Limit your overall intake of foods that have "empty calories." These are foods that have little nutritional value, such as sweets, desserts, candies, and sugar-sweetened beverages.  Eat a variety of foods (especially fruits and vegetables) to get a full range of vitamins and minerals.  Take a prenatal vitamin to help meet your additional vitamin and mineral needs during pregnancy, specifically for folic acid, iron, calcium, and vitamin D.  Remember to stay active. Ask your health care provider what types of exercise and activities are safe for you.  Practice good food safety and cleanliness. Wash your hands before you eat and after you prepare raw meat. Wash all fruits and vegetables well before peeling or eating. Taking these actions can help to prevent food-borne illnesses that can be very dangerous to your baby, such as listeriosis. Ask your health care provider for more information about listeriosis. What does 150 extra calories look like? Healthy options that provide 150 extra calories each day could be any of the following:  6-8 oz (170-230 g) of plain low-fat yogurt with  cup of berries.  1 apple with 2 teaspoons (11 g) of peanut butter.  Cut-up vegetables with  cup (60 g) of hummus.  8 oz (230 mL) or 1 cup of low-fat chocolate milk.  1 stick of string cheese with 1 medium orange.  1 peanut butter and jelly sandwich that is made with one slice of whole-wheat bread and 1 tsp (5 g) of peanut butter. For 300 extra calories, you could eat two of those healthy options each day. What is a healthy amount of weight to gain? The right amount of weight gain for you is based on  your BMI before you became pregnant. If your BMI:  Was less than 18 (underweight), you should gain 28-40 lb (13-18 kg).  Was 18-24.9 (normal), you should gain 25-35 lb (11-16 kg).  Was 25-29.9 (overweight), you should gain 15-25 lb (7-11 kg).  Was 30 or greater (obese), you should gain 11-20 lb (5-9 kg). What if I am having twins or multiples? Generally, if you are carrying twins or multiples:  You may need to eat 300-600 extra calories a day.  The recommended range for total weight gain is 25-54 lb (11-25 kg), depending on your BMI before pregnancy.  Talk with your health care provider to find out about nutritional needs, weight gain, and exercise that is right for you. What foods can I eat?  Grains All grains. Choose whole grains, such as whole-wheat bread, oatmeal, or brown rice. Vegetables All vegetables. Eat a variety of colors and types of vegetables. Remember to wash your vegetables well before peeling or eating. Fruits All fruits. Eat a variety of colors and types of fruit. Remember to wash your fruits well before peeling or eating. Meats and other protein foods Lean meats, including chicken, [place], fish, and lean cuts of beef, veal, or pork. If you eat fish or seafood, choose options that are higher in omega-3 fatty acids and lower in mercury, such as salmon, herring, mussels, trout, sardines, pollock, shrimp, crab, and lobster. Tofu. Tempeh. Beans. Eggs. Peanut butter and other nut butters. Make sure that all meats, poultry, and eggs are cooked to food-safe temperatures or "well-done." Two or more servings of fish are recommended each week in order to get the most benefits from  omega-3 fatty acids that are found in seafood. Choose fish that are lower in mercury. You can find more information online:  [place] Dairy Pasteurized milk and milk alternatives (such as almond milk). Pasteurized yogurt and pasteurized cheese. Cottage cheese. Sour cream. Beverages Water. Juices  that contain 100% fruit juice or vegetable juice. Caffeine-free teas and decaffeinated coffee. Drinks that contain caffeine are okay to drink, but it is better to avoid caffeine. Keep your total caffeine intake to less than 200 mg each day (which is 12 oz or 355 mL of coffee, tea, or soda) or the limit as told by your health care provider. Fats and oils Fats and oils are okay to include in moderation. Sweets and desserts Sweets and desserts are okay to include in moderation. Seasoning and other foods All pasteurized condiments. The items listed above may not be a complete list of recommended foods and beverages. Contact your dietitian for more options. The items listed above may not be a complete list of foods and beverages [you/your child] can eat. Contact a dietitian for more information. What foods are not recommended? Vegetables Raw (unpasteurized) vegetable juices. Fruits Unpasteurized fruit juices. Meats and other protein foods Lunch meats, bologna, hot dogs, or other deli meats. (If you must eat those meats, reheat them until they are steaming hot.) Refrigerated pat, meat spreads from a meat counter, smoked seafood that is found in the refrigerated section of a store. Raw or undercooked meats, poultry, and eggs. Raw fish, such as sushi or sashimi. Fish that have high mercury content, such as tilefish, shark, swordfish, and king mackerel. To learn more about mercury in fish, talk with your health care provider or look for online resources, such as:  [place] Dairy Raw (unpasteurized) milk and any foods that have raw milk in them. Soft cheeses, such as feta, queso blanco, queso fresco, Brie, Camembert cheeses, blue-veined cheeses, and Panela cheese (unless it is made with pasteurized milk, which must be stated on the label). Beverages Alcohol. Sugar-sweetened beverages, such as sodas, teas, or energy drinks. Seasoning and other foods Homemade fermented foods and drinks, such as  pickles, sauerkraut, or kombucha drinks. (Store-bought pasteurized versions of these are okay.) Salads that are made in a store or deli, such as ham salad, chicken salad, egg salad, tuna salad, and seafood salad. The items listed above may not be a complete list of foods and beverages to avoid. Contact your dietitian for more information. The items listed above may not be a complete list of foods and beverages [you/your child] should avoid. Contact a dietitian for more information. Where to find more information To calculate the number of calories you need based on your height, weight, and activity level, you can use an online calculator such as:  MobileTransition.ch To calculate how much weight you should gain during pregnancy, you can use an online pregnancy weight gain calculator such as:  StreamingFood.com.cy Summary  While you are pregnant, your body requires additional nutrition to help support your growing baby.  Eat a variety of foods, especially fruits and vegetables to get a full range of vitamins and minerals.  Practice good food safety and cleanliness. Wash your hands before you eat and after you prepare raw meat. Wash all fruits and vegetables well before peeling or eating. Taking these actions can help to prevent food-borne illnesses, such as listeriosis, that can be very dangerous to your baby.  Do not eat raw meat or fish. Do not eat fish that have high mercury content, such as  tilefish, shark, swordfish, and king mackerel. Do not eat unpasteurized (raw) dairy.  Take a prenatal vitamin to help meet your additional vitamin and mineral needs during pregnancy, specifically for folic acid, iron, calcium, and vitamin D. This information is not intended to replace advice given to you by your health care provider. Make sure you discuss any questions you have with your health care provider. Document Released: 09/23/2014 Document Revised:  04/01/2019 Document Reviewed: 09/05/2017 Elsevier Patient Education  2020 [employer].  Commonly Asked Questions During Pregnancy  Cats: A parasite can be excreted in cat feces.  To avoid exposure you need to have another person empty the little box.  If you must empty the litter box you will need to wear gloves.  Wash your hands after handling your cat.  This parasite can also be found in raw or undercooked meat so this should also be avoided.  Colds, Sore Throats, Flu: Please check your medication sheet to see what you can take for symptoms.  If your symptoms are unrelieved by these medications please call the office.  Dental Work: Most any dental work Investment banker, [employer] recommends is permitted.  X-rays should only be taken during the first trimester if absolutely necessary.  Your abdomen should be shielded with a lead apron during all x-rays.  Please notify your provider prior to receiving any x-rays.  Novocaine is fine; gas is not recommended.  If your dentist requires a note from [place] prior to dental work please call the office and we will provide one for you.  Exercise: Exercise is an important part of staying healthy during your pregnancy.  You may continue most exercises you were accustomed to prior to pregnancy.  Later in your pregnancy you will most likely notice you have difficulty with activities requiring balance like riding a bicycle.  It is important that you listen to your body and avoid activities that put you at a higher risk of falling.  Adequate rest and staying well hydrated are a must!  If you have questions about the safety of specific activities ask your provider.    Exposure to Children with illness: Try to avoid obvious exposure; report any symptoms to [place] when noted,  If you have chicken pos, red measles or mumps, you should be immune to these diseases.   Please do not take any vaccines while pregnant unless you have checked with your OB provider.  Fetal Movement: After 28 weeks we  recommend you do "kick counts" twice daily.  Lie or sit down in a calm quiet environment and count your baby movements "kicks".  You should feel your baby at least 10 times per hour.  If you have not felt 10 kicks within the first hour get up, walk around and have something sweet to eat or drink then repeat for an additional hour.  If count remains less than 10 per hour notify your provider.  Fumigating: Follow your pest control agent's advice as to how long to stay out of your home.  Ventilate the area well before re-entering.  Hemorrhoids:   Most over-the-counter preparations can be used during pregnancy.  Check your medication to see what is safe to use.  It is important to use a stool softener or fiber in your diet and to drink lots of liquids.  If hemorrhoids seem to be getting worse please call the office.   Hot Tubs:  Hot tubs Jacuzzis and saunas are not recommended while pregnant.  These increase your internal body temperature and  should be avoided.  Intercourse:  Sexual intercourse is safe during pregnancy as long as you are comfortable, unless otherwise advised by your provider.  Spotting may occur after intercourse; report any bright red bleeding that is heavier than spotting.  Labor:  If you know that you are in labor, please go to the hospital.  If you are unsure, please call the office and let us help you decide what to do.  Lifting, straining, etc:  If your job requires heavy lifting or straining please check with your provider for any limitations.  Generally, you should not lift items heavier than that you can lift simply with your hands and arms (no back muscles)  Painting:  Paint fumes do not harm your pregnancy, but may make you ill and should be avoided if possible.  Latex or water based paints have less odor than oils.  Use adequate ventilation while painting.  Permanents & Hair Color:  Chemicals in hair dyes are not recommended as they cause increase hair dryness which can  increase hair loss during pregnancy.  " Highlighting" and permanents are allowed.  Dye may be absorbed differently and permanents may not hold as well during pregnancy.  Sunbathing:  Use a sunscreen, as skin burns easily during pregnancy.  Drink plenty of fluids; avoid over heating.  Tanning Beds:  Because their possible side effects are still unknown, tanning beds are not recommended.  Ultrasound Scans:  Routine ultrasounds are performed at approximately 20 weeks.  You will be able to see your baby's general anatomy an if you would like to know the gender this can usually be determined as well.  If it is questionable when you conceived you may also receive an ultrasound early in your pregnancy for dating purposes.  Otherwise ultrasound exams are not routinely performed unless there is a medical necessity.  Although you can request a scan we ask that you pay for it when conducted because insurance does not cover " patient request" scans.  Work: If your pregnancy proceeds without complications you may work until your due date, unless your physician or employer advises otherwise.  Round Ligament Pain/Pelvic Discomfort:  Sharp, shooting pains not associated with bleeding are fairly common, usually occurring in the second trimester of pregnancy.  They tend to be worse when standing up or when you remain standing for long periods of time.  These are the result of pressure of certain pelvic ligaments called "round ligaments".  Rest, Tylenol and heat seem to be the most effective relief.  As the womb and fetus grow, they rise out of the pelvis and the discomfort improves.  Please notify the office if your pain seems different than that described.  It may represent a more serious condition.  Common Medications Safe in Pregnancy  Acne:      Constipation:  Benzoyl Peroxide     Colace  Clindamycin      Dulcolax Suppository  Topica Erythromycin     Fibercon  Salicylic  Acid      Metamucil         Miralax AVOID:        Senakot   Accutane    Cough:  Retin-A       Cough Drops  Tetracycline      Phenergan w/ Codeine if Rx  Minocycline      Robitussin (Plain & DM)  Antibiotics:     Crabs/Lice:  Ceclor       RID  Cephalosporins    AVOID:  E-Mycins  Kwell  Keflex  Macrobid/Macrodantin   Diarrhea:  Penicillin      Kao-Pectate  Zithromax      Imodium AD         PUSH FLUIDS AVOID:       Cipro     Fever:  Tetracycline      Tylenol (Regular or Extra  Minocycline       Strength)  Levaquin      Extra Strength-Do not          Exceed 8 tabs/24 hrs Caffeine:        <252m/day (equiv. To 1 cup of coffee or  approx. 3 12 oz sodas)         Gas: Cold/Hayfever:       Gas-X  Benadryl      Mylicon  Claritin       Phazyme  **Claritin-D        Chlor-Trimeton    Headaches:  Dimetapp      ASA-Free Excedrin  Drixoral-Non-Drowsy     Cold Compress  Mucinex (Guaifenasin)     Tylenol (Regular or Extra  Sudafed/Sudafed-12 Hour     Strength)  **Sudafed PE Pseudoephedrine   Tylenol Cold & Sinus     Vicks Vapor Rub  Zyrtec  **AVOID if Problems With Blood Pressure         Heartburn: Avoid lying down for at least 1 hour after meals  Aciphex      Maalox     Rash:  Milk of Magnesia     Benadryl    Mylanta       1% Hydrocortisone Cream  Pepcid  Pepcid Complete   Sleep Aids:  Prevacid      Ambien   Prilosec       Benadryl  Rolaids       Chamomile Tea  Tums (Limit 4/day)     Unisom  Zantac       Tylenol PM         Warm milk-add vanilla or  Hemorrhoids:       Sugar for taste  Anusol/Anusol H.C.  (RX: Analapram 2.5%)  Sugar Substitutes:  Hydrocortisone OTC     Ok in moderation  Preparation H      Tucks        Vaseline lotion applied to tissue with wiping    Herpes:     Throat:  Acyclovir      Oragel  Famvir  Valtrex     Vaccines:         Flu Shot Leg Cramps:       *Gardasil  Benadryl      Hepatitis A         Hepatitis B Nasal  Spray:       Pneumovax  Saline Nasal Spray     Polio Booster         Tetanus Nausea:       Tuberculosis test or PPD  Vitamin B6 25 mg TID   AVOID:    Dramamine      *Gardasil  Emetrol       Live Poliovirus  Ginger Root 250 mg QID    MMR (measles, mumps &  High Complex Carbs @ Bedtime    rebella)  Sea Bands-Accupressure    Varicella (Chickenpox)  Unisom 1/2 tab TID     *No known complications           If received before Pain:         Known pregnancy;  Darvocet       Resume series after  Lortab        Delivery  Percocet    Yeast:   Tramadol      Femstat  Tylenol 3      Gyne-lotrimin  Ultram       Monistat  Vicodin           Doxylamine; Pyridoxine delayed and extended-release tablets What is this medicine? Doxylamine; pyridoxine (dox IL a meen; peer i DOX een) is a combination of an antihistamine and vitamin B6. The drug is used to treat nausea and vomiting associated with pregnancy. This medicine may be used for other purposes; ask your health care provider or pharmacist if you have questions. COMMON BRAND NAME(S): Diclegis What should I tell my health care provider before I take this medicine? They need to know if you have any of these conditions:  contact lenses  glaucoma  liver disease  lung or breathing disease, like asthma or emphysema  pain or trouble passing urine  prostate trouble  ulcers or other stomach problems  an unusual or allergic reaction to doxylamine or pyridoxine (vitamin B6), other medicines, foods, dyes, or preservatives  breast-feeding How should I use this medicine? Take this medicine by mouth with a glass of water. Do not cut, crush or chew this medicine. Follow the directions on the package or prescription label. Take this medicine on an empty stomach. Take your medicine at regular intervals. Do not take it more often than directed. Talk to your pediatrician regarding the use of this medicine in children. Special care may be  needed. Overdosage: If you think you have taken too much of this medicine contact a poison control center or emergency room at once. NOTE: This medicine is only for you. Do not share this medicine with others. What if I miss a dose? If you miss a dose, take it as soon as you can. If it is almost time for your next dose, take only that dose. Do not take double or extra doses. What may interact with this medicine?  alcohol  atropine  antihistamines for allergy, cough and cold  certain medicines for bladder problems like oxybutynin, tolterodine  certain medicines for stomach problems like dicyclomine, hyoscyamine  certain medicines for travel sickness like scopolamine  certain medicines for Parkinson's disease like benztropine, trihexyphenidyl  ipratropium  MAOIs like Carbex, Eldepryl, Marplan, Nardil, and Parnate This list may not describe all possible interactions. Give your health care provider a list of all the medicines, herbs, non-prescription drugs, or dietary supplements you use. Also tell them if you smoke, drink alcohol, or use illegal drugs. Some items may interact with your medicine. What should I watch for while using this medicine? Tell your doctor or healthcare professional if your symptoms do not start to get better or if they get worse. See your doctor right away if you get a high fever or have problems breathing. Your mouth may get dry. Chewing sugarless gum or sucking hard candy, and drinking plenty of water may help. Contact your doctor if the problem does not go away or is severe. This medicine may cause dry eyes and blurred vision. If you wear contact lenses you may feel some discomfort. Lubricating drops may help. See your eye doctor if the problem does not go away or is severe. This medicine may cause false positive urine tests for certain medicines like methadone, opiates, and PCP. You may get drowsy or dizzy. Do not drive, use machinery,  or do anything that needs  mental alertness until you know how this medicine affects you. Do not stand or sit up quickly, especially if you are an older patient. This reduces the risk of dizzy or fainting spells. What side effects may I notice from receiving this medicine? Side effects that you should report to your doctor or health care professional as soon as possible:  allergic reactions like skin rash, itching or hives, swelling of the face, lips, or tongue  changes in vision  confused, agitated, or nervous  fast, irregular heartbeat  feeling faint or lightheaded, falls  muscle or facial twitches  seizure  trouble passing urine or change in the amount of urine Side effects that usually do not require medical attention (report to your doctor or health care professional if they continue or are bothersome):  dry mouth  headache  loss of appetite  stomach upset This list may not describe all possible side effects. Call your doctor for medical advice about side effects. You may report side effects to FDA at 1-800-FDA-1088. Where should I keep my medicine? Keep out of the reach of children. Store at room temperature between 15 and 30 degrees C (59 and 86 degrees F). Keep bottle tightly closed and protect from moisture. Do not remove desiccant canister from bottle. Throw away any unused medicine after the expiration date. NOTE: This sheet is a summary. It may not cover all possible information. If you have questions about this medicine, talk to your doctor, pharmacist, or health care provider.  2020 Elsevier/Gold Standard (2017-09-18 13:39:10)   MISC:         All Sunscreens           Hair Coloring/highlights          Insect Repellant's          (Including DEET)         Mystic Tans

## 2019-10-20 LAB — CYTOLOGY - PAP: Diagnosis: NEGATIVE

## 2019-10-27 ENCOUNTER — Encounter: Payer: Self-pay | Admitting: Certified Nurse Midwife

## 2019-10-28 ENCOUNTER — Other Ambulatory Visit: Payer: Self-pay

## 2019-10-28 MED ORDER — DOXYLAMINE-PYRIDOXINE 10-10 MG PO TBEC: 1.0000 | DELAYED_RELEASE_TABLET | Freq: Two times a day (BID) | ORAL | 2 refills | Status: DC

## 2019-11-15 ENCOUNTER — Ambulatory Visit (INDEPENDENT_AMBULATORY_CARE_PROVIDER_SITE_OTHER): Payer: Commercial Managed Care - PPO | Admitting: Certified Nurse Midwife

## 2019-11-15 ENCOUNTER — Other Ambulatory Visit: Payer: Self-pay

## 2019-11-15 VITALS — BP 104/62 | HR 72 | Wt 100.1 lb

## 2019-11-15 DIAGNOSIS — Z3A13 13 weeks gestation of pregnancy: Secondary | ICD-10-CM

## 2019-11-15 DIAGNOSIS — Z3491 Encounter for supervision of normal pregnancy, unspecified, first trimester: Secondary | ICD-10-CM

## 2019-11-15 LAB — POCT URINALYSIS DIPSTICK OB
Bilirubin, UA: NEGATIVE
Blood, UA: NEGATIVE
Glucose, UA: NEGATIVE
Ketones, UA: NEGATIVE
Leukocytes, UA: NEGATIVE
Nitrite, UA: NEGATIVE
POC,PROTEIN,UA: NEGATIVE
Spec Grav, UA: 1.02 (ref 1.010–1.025)
Urobilinogen, UA: 0.2 E.U./dL
pH, UA: 5 (ref 5.0–8.0)

## 2019-11-15 NOTE — Patient Instructions (Signed)

## 2019-11-15 NOTE — Progress Notes (Signed)
Body mass index is 18.92 kg/m. ROB doing well. Not feeling movement yet. Discussed anatomy u/s @ 20-21 wks. She verbalize understanding. Reviewed exercise in pregnancy. Discussed position for sleeping during pregnancy . Follow up 5 wks .   Philip Aspen, CNM

## 2019-12-20 ENCOUNTER — Encounter: Payer: Commercial Managed Care - PPO | Admitting: Certified Nurse Midwife

## 2019-12-24 NOTE — L&D Delivery Note (Signed)
Delivery Note  Date of delivery: 05/20/2020 Estimated Date of Delivery: 05/17/20 Patient's last menstrual period was 07/28/2019 (exact date). EGA: [redacted]w[redacted]d  Delivery Note At 9:05 AM a viable female was delivered via Vaginal, Spontaneous (Presentation: Left Occiput Anterior).  APGAR: 9, 9; weight 7 lb 0.9 oz (3200 g).   Placenta status: Retained, removed in the OR.  Cord: 3 vessels with the following complications: None.    Anesthesia: Epidural Episiotomy: None Lacerations: None Suture Repair: n/a Est. Blood Loss (mL): 425   First Stage: Labor onset: 0200 Augmentation : Pitocin Analgesia /Anesthesia intrapartum: Epidural SROM at [employer] presented to L&D with DFM and contractions. She was augmented with pitocin. Epidural placed.   Second Stage: Complete dilation at 0701 Onset of pushing at 0840 FHR second stage Cat I Delivery at 0905 on 05/20/2020  She progressed to complete and had a spontaneous vaginal birth of a live female over an intact perineum under epidural anesthesia. The fetal head was delivered in OA position with restitution to LOA. No nuchal cord. Anterior then posterior shoulders delivered spontaneously. Baby placed on mom's abdomen and attended to by transition RN. Cord clamped and cut when pulseless by FOB. Cord blood obtained for newborn labs.  of Cytotec PR for bleeding prophylaxis.  Third Stage: Placenta delivered in the OR by Dr. Feliberto Gottron for retained placenta Placenta disposition: Pathology Uterine tone firm / bleeding at delivery + 350 in OR IV pitocin given for hemorrhage prophylaxis  No laceration identified  Anesthesia for repair: n/a Repair n/a Est. Blood Loss (mL): at delivery + 350 in OR  Complications: Retained placenta  Mom to OR.  Baby to Couplet care / Skin to Skin.  Newborn: Birth Weight: 7#0.9   3200g  Apgar Scores: 9/9 Feeding planned: Breast "[address]   Rachel Waters, CNM 05/20/2020 12:51  PM

## 2020-03-04 ENCOUNTER — Encounter: Payer: Self-pay | Admitting: Obstetrics and Gynecology

## 2020-03-04 ENCOUNTER — Observation Stay
Admission: EM | Admit: 2020-03-04 | Discharge: 2020-03-05 | Disposition: A | Discharge status: Home / Self Care | Payer: Commercial Managed Care - PPO

## 2020-03-04 DIAGNOSIS — O368130 Decreased fetal movements, third trimester, not applicable or unspecified: Principal | ICD-10-CM | POA: Insufficient documentation

## 2020-03-04 DIAGNOSIS — O368190 Decreased fetal movements, unspecified trimester, not applicable or unspecified: Secondary | ICD-10-CM | POA: Diagnosis present

## 2020-03-04 DIAGNOSIS — Z3A29 29 weeks gestation of pregnancy: Secondary | ICD-10-CM | POA: Insufficient documentation

## 2020-03-04 HISTORY — DX: Other specified health status: Z78.9

## 2020-03-04 NOTE — OB Triage Note (Signed)
Pt is a 23y/o G3P1 at [redacted]w[redacted]d with c/o decreased fetal movement. Pt states she noticed a decrease in movement an hour ago and tried to elicit a response by poking her belly and did not feel a kick. Pt denies LOF, CTX and VB. Monitors applied and assessing. Initial FHT 140.

## 2020-03-05 ENCOUNTER — Other Ambulatory Visit: Payer: Self-pay

## 2020-03-05 DIAGNOSIS — O368190 Decreased fetal movements, unspecified trimester, not applicable or unspecified: Secondary | ICD-10-CM | POA: Diagnosis present

## 2020-03-05 DIAGNOSIS — O368130 Decreased fetal movements, third trimester, not applicable or unspecified: Secondary | ICD-10-CM | POA: Diagnosis not present

## 2020-03-05 DIAGNOSIS — Z3A29 29 weeks gestation of pregnancy: Secondary | ICD-10-CM | POA: Diagnosis not present

## 2020-03-05 NOTE — Discharge Summary (Signed)
Rachel Waters is a 24 y.o. female. She is at [redacted]w[redacted]d gestation. Patient's last menstrual period was 07/28/2019 (exact date). Estimated Date of Delivery: 05/17/20  Prenatal care site: [facility]   Chief complaint: decreased fetal movement   Reports decreased fetal movement that started an hour before arrival.  States that she "poked" at her stomach to try to get baby to move but it didn't work.    S: Resting comfortably. no CTX, no VB.no LOF.  Reports fetal movement since application of EFM.    Maternal Medical History:  Past Medical Hx: past medical history reviewed, no pertinent history     Past Surgical Hx: surgical history reviewed, no pertinent history   No Known Allergies   Prior to Admission medications   Medication Sig Start Date End Date Taking? Authorizing Provider  Prenatal Vit-Fe Fumarate-FA (MULTIVITAMIN-PRENATAL) 27-0.8 MG TABS tablet Take 1 tablet by mouth daily at 12 noon.   Yes [provider]  Doxylamine-Pyridoxine 10-10 MG TBEC Take 1 tablet by mouth 2 (two) times daily. Patient not taking: Reported on 03/04/2020 10/28/19   Gunnar Bulla, CNM    Social History: She  reports that she has never smoked. She has never used smokeless tobacco. She reports previous alcohol use. She reports that she does not use drugs.  Family History: Family history reviewed, no pertinent history, no history of gyn cancers  Review of Systems: A full review of systems was performed and negative except as noted in the HPI.    O:  BP 107/66 (BP Location: Right Arm)   Pulse 81   Temp 97.7 F (36.5 C) (Oral)   Resp 16   LMP 07/28/2019 (Exact Date)     NST:  Baseline: 135 Variability: moderate Accelerations: 10x10 present x >2 Decelerations absent Time  Assessment: 24 y.o. [redacted]w[redacted]d here for antenatal surveillance during pregnancy.  Principle diagnosis: reassuring fetal status   Plan:  Fetal Wellbeing: Reassuring Cat 1 tracing.  Reactive NST -  appropriate for gestational age  Reports fetal movement since application of EFM  Reassurance given, reviewed kick counts  D/c home stable, precautions reviewed, follow-up as scheduled.   ----- Margaretmary Eddy, CNM Certified Nurse Midwife [facility]

## 2020-04-19 LAB — OB RESULTS CONSOLE RPR: RPR: NONREACTIVE

## 2020-04-22 ENCOUNTER — Observation Stay
Admission: EM | Admit: 2020-04-22 | Discharge: 2020-04-22 | Disposition: A | Discharge status: Home / Self Care | Payer: Commercial Managed Care - PPO | Attending: Obstetrics and Gynecology | Admitting: Obstetrics and Gynecology

## 2020-04-22 DIAGNOSIS — Z3A36 36 weeks gestation of pregnancy: Secondary | ICD-10-CM | POA: Insufficient documentation

## 2020-04-22 DIAGNOSIS — B9689 Other specified bacterial agents as the cause of diseases classified elsewhere: Secondary | ICD-10-CM | POA: Insufficient documentation

## 2020-04-22 DIAGNOSIS — Z20822 Contact with and (suspected) exposure to covid-19: Secondary | ICD-10-CM | POA: Diagnosis not present

## 2020-04-22 DIAGNOSIS — O23593 Infection of other part of genital tract in pregnancy, third trimester: Secondary | ICD-10-CM | POA: Diagnosis present

## 2020-04-22 DIAGNOSIS — R1030 Lower abdominal pain, unspecified: Secondary | ICD-10-CM | POA: Diagnosis present

## 2020-04-22 LAB — URINALYSIS, COMPLETE (UACMP) WITH MICROSCOPIC
Bilirubin Urine: NEGATIVE
Glucose, UA: NEGATIVE mg/dL
Hgb urine dipstick: NEGATIVE
Ketones, ur: NEGATIVE mg/dL
Leukocytes,Ua: NEGATIVE
Nitrite: NEGATIVE
Protein, ur: 30 mg/dL — AB
Specific Gravity, Urine: 1.021 (ref 1.005–1.030)
pH: 6 (ref 5.0–8.0)

## 2020-04-22 LAB — WET PREP, GENITAL
Sperm: NONE SEEN
Trich, Wet Prep: NONE SEEN
Yeast Wet Prep HPF POC: NONE SEEN

## 2020-04-22 LAB — RUPTURE OF MEMBRANE (ROM)PLUS: Rom Plus: NEGATIVE

## 2020-04-22 LAB — RESPIRATORY PANEL BY RT PCR (FLU A&B, COVID)
Influenza A by PCR: NEGATIVE
Influenza B by PCR: NEGATIVE
SARS Coronavirus 2 by RT PCR: NEGATIVE

## 2020-04-22 MED ORDER — METRONIDAZOLE 500 MG PO TABS
500.0000 mg | ORAL_TABLET | Freq: Two times a day (BID) | ORAL | Status: DC
  Administered 2020-04-22: 500 mg via ORAL
  Filled 2020-04-22: qty 1

## 2020-04-22 MED ORDER — METRONIDAZOLE 500 MG PO TABS: 500.0000 mg | ORAL_TABLET | Freq: Two times a day (BID) | ORAL | 0 refills | Status: AC

## 2020-04-22 NOTE — Discharge Instructions (Signed)
Bacterial Vaginosis  Bacterial vaginosis is a vaginal infection that occurs when the normal balance of bacteria in the vagina is disrupted. It results from an overgrowth of certain bacteria. This is the most common vaginal infection among women ages 15-44. Because bacterial vaginosis increases your risk for STIs (sexually transmitted infections), getting treated can help reduce your risk for chlamydia, gonorrhea, herpes, and HIV (human immunodeficiency virus). Treatment is also important for preventing complications in pregnant women, because this condition can cause an early (premature) delivery. What are the causes? This condition is caused by an increase in harmful bacteria that are normally present in small amounts in the vagina. However, the reason that the condition develops is not fully understood. What increases the risk? The following factors may make you more likely to develop this condition:  Having a new sexual partner or multiple sexual partners.  Having unprotected sex.  Douching.  Having an intrauterine device (IUD).  Smoking.  Drug and alcohol abuse.  Taking certain antibiotic medicines.  Being pregnant. You cannot get bacterial vaginosis from toilet seats, bedding, swimming pools, or contact with objects around you. What are the signs or symptoms? Symptoms of this condition include:  Grey or white vaginal discharge. The discharge can also be watery or foamy.  A fish-like odor with discharge, especially after sexual intercourse or during menstruation.  Itching in and around the vagina.  Burning or pain with urination. Some women with bacterial vaginosis have no signs or symptoms. How is this diagnosed? This condition is diagnosed based on:  Your medical history.  A physical exam of the vagina.  Testing a sample of vaginal fluid under a microscope to look for a large amount of bad bacteria or abnormal cells. Your health care provider may use a cotton swab or  a small wooden spatula to collect the sample. How is this treated? This condition is treated with antibiotics. These may be given as a pill, a vaginal cream, or a medicine that is put into the vagina (suppository). If the condition comes back after treatment, a second round of antibiotics may be needed. Follow these instructions at home: Medicines  Take over-the-counter and prescription medicines only as told by your health care provider.  Take or use your antibiotic as told by your health care provider. Do not stop taking or using the antibiotic even if you start to feel better. General instructions  If you have a female sexual partner, tell her that you have a vaginal infection. She should see her health care provider and be treated if she has symptoms. If you have a female sexual partner, he does not need treatment.  During treatment: ? Avoid sexual activity until you finish treatment. ? Do not douche. ? Avoid alcohol as directed by your health care provider. ? Avoid breastfeeding as directed by your health care provider.  Drink enough water and fluids to keep your urine clear or pale yellow.  Keep the area around your vagina and rectum clean. ? Wash the area daily with warm water. ? Wipe yourself from front to back after using the toilet.  Keep all follow-up visits as told by your health care provider. This is important. How is this prevented?  Do not douche.  Wash the outside of your vagina with warm water only.  Use protection when having sex. This includes latex condoms and dental dams.  Limit how many sexual partners you have. To help prevent bacterial vaginosis, it is best to have sex with just one partner (  monogamous).  Make sure you and your sexual partner are tested for STIs.  Wear cotton or cotton-lined underwear.  Avoid wearing tight pants and pantyhose, especially during summer.  Limit the amount of alcohol that you drink.  Do not use any products that contain  nicotine or tobacco, such as cigarettes and e-cigarettes. If you need help quitting, ask your health care provider.  Do not use illegal drugs. Where to find more information  Centers for Disease Control and Prevention: www.cdc.gov/std  American Sexual Health Association (ASHA): www.ashastd.org  U.S. Department of Health and Human Services, Office on Women's Health: www.womenshealth.gov/ or https://www.womenshealth.gov/a-z-topics/bacterial-vaginosis Contact a health care provider if:  Your symptoms do not improve, even after treatment.  You have more discharge or pain when urinating.  You have a fever.  You have pain in your abdomen.  You have pain during sex.  You have vaginal bleeding between periods. Summary  Bacterial vaginosis is a vaginal infection that occurs when the normal balance of bacteria in the vagina is disrupted.  Because bacterial vaginosis increases your risk for STIs (sexually transmitted infections), getting treated can help reduce your risk for chlamydia, gonorrhea, herpes, and HIV (human immunodeficiency virus). Treatment is also important for preventing complications in pregnant women, because the condition can cause an early (premature) delivery.  This condition is treated with antibiotic medicines. These may be given as a pill, a vaginal cream, or a medicine that is put into the vagina (suppository). This information is not intended to replace advice given to you by your health care provider. Make sure you discuss any questions you have with your health care provider. Document Revised: 11/21/2017 Document Reviewed: 08/24/2016 Elsevier Patient Education  2020 Elsevier [employer]  

## 2020-04-22 NOTE — Discharge Summary (Signed)
Patient ID: Rachel Waters MRN: [id number] DOB/AGE: 03-27-1996 24 y.o.  Admit date: 04/22/2020 Discharge date: 04/23/2020  Admission Diagnoses: Lower abdominal pain, N/V, and brown discharge  Discharge Diagnoses: Bacterial Vaginosis  Prenatal Procedures: NST  Consults: none  Significant Diagnostic Studies:  Results for orders placed or performed during the hospital encounter of 04/22/20 (from the past 168 hour(s))  Wet prep, genital   Collection Time: 04/22/20  3:16 AM  Result Value Ref Range   Yeast Wet Prep HPF POC NONE SEEN NONE SEEN   Trich, Wet Prep NONE SEEN NONE SEEN   Clue Cells Wet Prep HPF POC PRESENT (A) NONE SEEN   WBC, Wet Prep HPF POC MANY (A) NONE SEEN   Sperm NONE SEEN   Respiratory Panel by RT PCR (Flu A&B, Covid) -   Collection Time: 04/22/20  3:16 AM  Result Value Ref Range   SARS Coronavirus 2 by RT PCR NEGATIVE NEGATIVE   Influenza A by PCR NEGATIVE NEGATIVE   Influenza B by PCR NEGATIVE NEGATIVE  Urinalysis, Complete w Microscopic   Collection Time: 04/22/20  3:16 AM  Result Value Ref Range   Color, Urine YELLOW (A) YELLOW   APPearance HAZY (A) CLEAR   Specific Gravity, Urine 1.021 1.005 - 1.030   pH 6.0 5.0 - 8.0   Glucose, UA NEGATIVE NEGATIVE mg/dL   Hgb urine dipstick NEGATIVE NEGATIVE   Bilirubin Urine NEGATIVE NEGATIVE   Ketones, ur NEGATIVE NEGATIVE mg/dL   Protein, ur 30 (A) NEGATIVE mg/dL   Nitrite NEGATIVE NEGATIVE   Leukocytes,Ua NEGATIVE NEGATIVE   RBC / HPF 0-5 0 - 5 RBC/hpf   WBC, UA 0-5 0 - 5 WBC/hpf   Bacteria, UA RARE (A) NONE SEEN   Squamous Epithelial / LPF 0-5 0 - 5   Mucus PRESENT   ROM Plus ([facility] only)   Collection Time: 04/22/20  3:34 AM  Result Value Ref Range   Rom Plus NEGATIVE     Treatments: antibiotics: metronidazole  Hospital Course:  This is a 24 y.o. G3P0020 with IUP at [redacted]w[redacted]d seen for lower abdominal pain, noted to have a cervical exam of 0/0/-3.  No leaking of fluid and no bleeding.  Labs all negative except  wet was positive for BV and urinalysis is pending.  She was observed, fetal heart rate monitoring remained reassuring, and she had no signs/symptoms of preterm labor or other maternal-fetal concerns. She was deemed stable for discharge to home with outpatient follow up.  Discharge Physical Exam:  BP 109/75 (BP Location: Right Arm)   Pulse 90   Temp 98 F (36.7 C) (Oral)   Resp 18   LMP 07/28/2019 (Exact Date)   General: NAD CV: RRR Pulm: nl effort ABD: s/nd/nt, gravid DVT Evaluation: LE non-ttp, no evidence of DVT on exam.  FHT: FHR: 140 bpm, variability: moderate,  accelerations:  Present,  decelerations:  Absent Category/reactivity:  Category I TOCO: none SVE: Dilation: Closed / Effacement (%): 0 / Station: -3    Cephalic by SVE    Discharge Condition: Stable  Disposition:  There are no questions and answers to display.         Allergies as of 04/22/2020   No Known Allergies     Medication List    STOP taking these medications   Doxylamine-Pyridoxine 10-10 MG Tbec     TAKE these medications   metroNIDAZOLE 500 MG tablet Commonly known as: FLAGYL Take 1 tablet (500 mg total) by mouth every 12 (twelve) hours for 13  doses.   multivitamin-prenatal 27-0.8 MG Tabs tablet Take 1 tablet by mouth daily at 12 noon.      Follow-up Information    [facility]. Schedule an appointment as soon as possible for a visit in 2 day(s).   Contact information: [address] [place] [address] [phone]          Signed:  Quillian Quince 04/23/2020 3:55 PM

## 2020-04-22 NOTE — OB Triage Note (Signed)
Patient came in for observation for lower abdominal pain and vomitign that started at 0100. Patient denies uteirne contractions. Patient reports +FM. Patient rates pain 8/10. Patient complains of leaking of clear fluid but denies vaginal bleeding and spotting. Vital signs stable and patient afebrile. FHR baseline 140 with moderate variability with accelerations 15 x 15 and no decelerations. Significant other at bedside. Monitors applied and tracing.

## 2020-04-22 NOTE — ED Notes (Signed)
VS on arrival to ED  95/71 BP 85 HR 98% RA 16 RR   Pt with 1 episode of emesis in route to L&D

## 2020-05-16 ENCOUNTER — Encounter: Payer: Self-pay | Admitting: Obstetrics and Gynecology

## 2020-05-16 ENCOUNTER — Other Ambulatory Visit: Payer: Self-pay | Admitting: Obstetrics and Gynecology

## 2020-05-16 ENCOUNTER — Other Ambulatory Visit: Payer: Self-pay

## 2020-05-16 ENCOUNTER — Observation Stay
Admission: EM | Admit: 2020-05-16 | Discharge: 2020-05-16 | Disposition: A | Discharge status: Home / Self Care | Payer: Commercial Managed Care - PPO | Source: Home / Self Care | Admitting: Obstetrics and Gynecology

## 2020-05-16 DIAGNOSIS — O368130 Decreased fetal movements, third trimester, not applicable or unspecified: Secondary | ICD-10-CM | POA: Insufficient documentation

## 2020-05-16 DIAGNOSIS — Z3A39 39 weeks gestation of pregnancy: Secondary | ICD-10-CM | POA: Insufficient documentation

## 2020-05-16 DIAGNOSIS — O368190 Decreased fetal movements, unspecified trimester, not applicable or unspecified: Secondary | ICD-10-CM | POA: Diagnosis present

## 2020-05-16 NOTE — OB Triage Note (Signed)
Pt. presented to L/D triage with reported decreased fetal movement that has been ongoing for a few days. She does report that she has felt fetal movement. No pain. She reports no bleeding, but noted a small gush of clear fluid yesterday with no discharge or fluid leakage since. VSS. Monitors applied and assessing. FHT 145.

## 2020-05-16 NOTE — Progress Notes (Signed)
   History of Present Illness:  Chief Complaint:   HPI:  Rachel Waters is a 24 y.o. G6P0020 female at [redacted]w[redacted]d dated by u/s.  She presents to L&D for IOL for post-dates    Pregnancy Issues: 1. Transfer of care   from [facility] at [redacted]w[redacted]d 2. Perinatal depression at 34wks  Reports feeling "disconnected from baby"  Will need close f/u postpartum and SW visit in hospital.  3. Size < dates 02/24/20  04/19/2020 [redacted]w[redacted]d: EFW: 3009Q 5lb14oz 35%, AFI: 14.9cm   Prenatal care site: [facility]   EFW: 3300T 5lb14oz 35%  Pertinent Results:  Prenatal Labs: Blood type/Rh O pos  Antibody screen neg  Rubella Immune  Varicella Immune  RPR NR  HBsAg Neg  HIV NR  GC neg  Chlamydia neg  Genetic screening negative  1 hour GTT 60  3 hour GTT   GBS neg   Tdap - 03/09/20 Flu - declined   Assessment:  Rachel Waters is a 24 y.o. G3P0020 female at [redacted]w[redacted]d presents for IOL for post-dates.   Plan:  1. Admit to Labor & Delivery; consents reviewed and obtained  2. Fetal Well being  - Fetal Tracing:  - GBSneg - Presentation:  confirmed by    3. Routine OB: - Prenatal labs reviewed, as above - Rh pos - CBC & T&S on admit - Clear fluids, IVFsaline lock  4. Induction of Labor -  Contractions external toco in place -  Plan for induction with  -  Plan for continuous fetal monitoring  -  Maternal pain control as desired: IVPM, nitrous, regional anesthesia - Anticipate vaginal delivery  5. Post Partum Planning: - Infant feeding:  - Contraception:   Haroldine Laws, CNM 05/16/2020 11:55 PM

## 2020-05-16 NOTE — OB Triage Provider Note (Signed)
Patient ID: Rachel Waters MRN: [id number] DOB/AGE: 08-16-1996 23 y.o.  Admit date: 05/16/2020 Discharge date: 05/17/2020  Admission Diagnoses: Decreased FM  Discharge Diagnoses: Decreased FM resolved  Prenatal Procedures: NST  Consults: Neonatology, Maternal Fetal Medicine  Significant Diagnostic Studies:  No results found for this or any previous visit (from the past 168 hour(s)).  Treatments: none  Hospital Course:  This is a 24 y.o. G3P0020 with IUP at [redacted]w[redacted]d admitted from the office for decreased fetal movement.  No leaking of fluid and no bleeding.  NST reactive, SVE 1/70/-2 Anterior, vtx. IOL will be scheduled for 41 weeks.  She was deemed stable for discharge to home with outpatient follow up.  Discharge Physical Exam:  BP 99/71 (BP Location: Left Arm)   Pulse 88   Temp 98.3 F (36.8 C) (Oral)   Resp 18   Ht 5\' 1"  (1.549 m)   Wt 60.3 kg   LMP 07/28/2019 (Exact Date)   BMI 25.13 kg/m   General: NAD CV: RRR Pulm: nl effort ABD: s/nd/nt, gravid DVT Evaluation: LE non-ttp, no evidence of DVT on exam.  FHT: FHR: 135 bpm, variability: moderate,  accelerations:  Present,  decelerations:  Absent NST:  Reactive TOCO: quiet   Discharge Condition: Stable  Disposition: Discharge disposition: 01-Home or Self Care        Allergies as of 05/16/2020   No Known Allergies     Medication List    TAKE these medications   multivitamin-prenatal 27-0.8 MG Tabs tablet Take 1 tablet by mouth daily at 12 noon.      Follow-up Information    [facility]. Schedule an appointment as soon as possible for a visit in 2 day(s).   Contact information: [address] [place] [address]          Signed:  [phone], CNM 05/17/2020 7:00 AM

## 2020-05-19 ENCOUNTER — Encounter: Payer: Self-pay | Admitting: Obstetrics and Gynecology

## 2020-05-19 ENCOUNTER — Other Ambulatory Visit: Payer: Self-pay

## 2020-05-19 ENCOUNTER — Inpatient Hospital Stay
Admission: EM | Admit: 2020-05-19 | Discharge: 2020-05-22 | DRG: 797 | Disposition: A | Discharge status: Home / Self Care | Payer: Commercial Managed Care - PPO | Attending: Certified Nurse Midwife | Admitting: Certified Nurse Midwife

## 2020-05-19 DIAGNOSIS — Z3A40 40 weeks gestation of pregnancy: Secondary | ICD-10-CM

## 2020-05-19 DIAGNOSIS — O479 False labor, unspecified: Secondary | ICD-10-CM | POA: Diagnosis present

## 2020-05-19 DIAGNOSIS — O4292 Full-term premature rupture of membranes, unspecified as to length of time between rupture and onset of labor: Principal | ICD-10-CM | POA: Diagnosis present

## 2020-05-19 DIAGNOSIS — O9081 Anemia of the puerperium: Secondary | ICD-10-CM | POA: Diagnosis not present

## 2020-05-19 DIAGNOSIS — D62 Acute posthemorrhagic anemia: Secondary | ICD-10-CM | POA: Diagnosis not present

## 2020-05-19 DIAGNOSIS — Z20822 Contact with and (suspected) exposure to covid-19: Secondary | ICD-10-CM | POA: Diagnosis present

## 2020-05-19 LAB — CBC
HCT: 36.7 % (ref 36.0–46.0)
Hemoglobin: 12.2 g/dL (ref 12.0–15.0)
MCH: 28 pg (ref 26.0–34.0)
MCHC: 33.2 g/dL (ref 30.0–36.0)
MCV: 84.2 fL (ref 80.0–100.0)
Platelets: 199 10*3/uL (ref 150–400)
RBC: 4.36 MIL/uL (ref 3.87–5.11)
RDW: 13.9 % (ref 11.5–15.5)
WBC: 8 10*3/uL (ref 4.0–10.5)
nRBC: 0 % (ref 0.0–0.2)

## 2020-05-19 LAB — SARS CORONAVIRUS 2 BY RT PCR (HOSPITAL ORDER, PERFORMED IN ~~LOC~~ HOSPITAL LAB): SARS Coronavirus 2: NEGATIVE

## 2020-05-19 LAB — TYPE AND SCREEN
ABO/RH(D): O POS
Antibody Screen: NEGATIVE

## 2020-05-19 LAB — RUPTURE OF MEMBRANE (ROM)PLUS: Rom Plus: POSITIVE

## 2020-05-19 LAB — ABO/RH: ABO/RH(D): O POS

## 2020-05-19 MED ORDER — ACETAMINOPHEN 325 MG PO TABS
650.0000 mg | ORAL_TABLET | ORAL | Status: DC | PRN
  Filled 2020-05-19: qty 2

## 2020-05-19 MED ORDER — BUTORPHANOL TARTRATE 1 MG/ML IJ SOLN: 1.0000 mg | INTRAMUSCULAR | Status: DC | PRN

## 2020-05-19 MED ORDER — OXYTOCIN 40 UNITS IN NORMAL SALINE INFUSION - SIMPLE MED: 2.5000 [IU]/h | INTRAVENOUS | Status: DC

## 2020-05-19 MED ORDER — ONDANSETRON HCL 4 MG/2ML IJ SOLN: 4.0000 mg | Freq: Four times a day (QID) | INTRAMUSCULAR | Status: DC | PRN

## 2020-05-19 MED ORDER — OXYTOCIN 40 UNITS IN NORMAL SALINE INFUSION - SIMPLE MED
1.0000 m[IU]/min | INTRAVENOUS | Status: DC
  Administered 2020-05-19: 2 m[IU]/min via INTRAVENOUS
  Filled 2020-05-19: qty 1000

## 2020-05-19 MED ORDER — AMMONIA AROMATIC IN INHA
RESPIRATORY_TRACT | Status: AC
  Filled 2020-05-19: qty 10

## 2020-05-19 MED ORDER — MISOPROSTOL 200 MCG PO TABS
ORAL_TABLET | ORAL | Status: AC
  Administered 2020-05-20: 800 ug via RECTAL
  Filled 2020-05-19: qty 4

## 2020-05-19 MED ORDER — LACTATED RINGERS IV SOLN: 500.0000 mL | INTRAVENOUS | Status: DC | PRN

## 2020-05-19 MED ORDER — LACTATED RINGERS IV SOLN: INTRAVENOUS | Status: DC

## 2020-05-19 MED ORDER — SOD CITRATE-CITRIC ACID 500-334 MG/5ML PO SOLN: 30.0000 mL | ORAL | Status: DC | PRN

## 2020-05-19 MED ORDER — TERBUTALINE SULFATE 1 MG/ML IJ SOLN: 0.2500 mg | Freq: Once | INTRAMUSCULAR | Status: DC | PRN

## 2020-05-19 MED ORDER — LIDOCAINE HCL (PF) 1 % IJ SOLN
30.0000 mL | INTRAMUSCULAR | Status: DC | PRN
  Filled 2020-05-19: qty 30

## 2020-05-19 MED ORDER — OXYTOCIN 10 UNIT/ML IJ SOLN
INTRAMUSCULAR | Status: AC
  Filled 2020-05-19: qty 2

## 2020-05-19 MED ORDER — OXYTOCIN BOLUS FROM INFUSION
500.0000 mL | Freq: Once | INTRAVENOUS | Status: AC
  Administered 2020-05-20: 500 mL via INTRAVENOUS

## 2020-05-19 NOTE — OB Triage Note (Signed)
Pt reports for CTX since yesterday evening that have come and gone. Pt reports today that ctx have gradually got stronger. Reports 8/10 tightening every 10-12 minutes. Reports not feeling baby move as much. Reports small bleeding when she wipes since SVE earlier this week. Reports she lost her mucus plug earlier this am. No discharge or LOF since. VSS. Monitor applied. Initial FHT 155. VSS. CNM notified. Verbal order for ROM + and recheck pt in 2hrs.

## 2020-05-19 NOTE — H&P (Signed)
OB History & Physical   History of Present Illness:  Chief Complaint: contractions  HPI:  Rachel Waters is a 24 y.o. G64P0020 female at [redacted]w[redacted]d dated by [redacted]w[redacted]d ultrasound.  She presents to L&D for decreased fetal movement and contractions.   She reports:  -no big gushes of fluid, but some very thin discharge  -no vaginal bleeding -onset of contractions yesterday evening, intermittent throughout the day, currently every 10-12 minutes  Pregnancy Issues: 1. Transfer of care from [facility] at [redacted]w[redacted]d 2. Perinatal depression, plan SW consult in hospital 3. S<D 02/24/2020, 04/19/2020 growth ultrasound EFW 2658g 5lb14oz 35%   Maternal Medical History:   Past Medical History:  Diagnosis Date  . Medical history non-contributory     Past Surgical History:  Procedure Laterality Date  . NO PAST SURGERIES      No Known Allergies  Prior to Admission medications   Medication Sig Start Date End Date Taking? Authorizing Provider  Prenatal Vit-Fe Fumarate-FA (MULTIVITAMIN-PRENATAL) 27-0.8 MG TABS tablet Take 1 tablet by mouth daily at 12 noon.   Yes [provider]     Prenatal care site: [facility] History: She  reports that she has never smoked. She has never used smokeless tobacco. She reports previous alcohol use. She reports that she does not use drugs.  Family History: family history is not on file.   Review of Systems: A full review of systems was performed and negative except as noted in the HPI.    Physical Exam:  Vital Signs: BP 117/77 (BP Location: Right Arm)   Pulse 97   Temp 98.3 F (36.8 C) (Oral)   Resp 16   Ht 5' (1.524 m)   Wt 59.4 kg   LMP 07/28/2019 (Exact Date)   BMI 25.58 kg/m   General:   alert, cooperative, appears stated age and no distress  Skin:  normal and no rash or abnormalities  Neurologic:    Alert & oriented x 3  Lungs:   clear to auscultation bilaterally  Heart:   regular rate and rhythm, S1, S2 normal, no  murmur, click, rub or gallop  Abdomen:  soft, non-tender; bowel sounds normal; no masses,  no organomegaly  Pelvis:  External genitalia: normal general appearance  FHT:  140 BPM  Presentations: cephalic  Cervix:    Dilation: 3cm   Effacement: 75%   Station:  -2   Consistency: soft   Position: posterior  Extremities: : non-tender, symmetric, no edema bilaterally.     EFW: 6lb4oz  Results for orders placed or performed during the hospital encounter of 05/19/20 (from the past 24 hour(s))  ROM Plus ([place] only)     Status: None   Collection Time: 05/19/20  6:20 PM  Result Value Ref Range   Rom Plus POSITIVE   CBC     Status: None   Collection Time: 05/19/20  7:25 PM  Result Value Ref Range   WBC 8.0 4.0 - 10.5 K/uL   RBC 4.36 3.87 - 5.11 MIL/uL   Hemoglobin 12.2 12.0 - 15.0 g/dL   HCT 36.7 36.0 - 46.0 %   MCV 84.2 80.0 - 100.0 fL   MCH 28.0 26.0 - 34.0 pg   MCHC 33.2 30.0 - 36.0 g/dL   RDW 13.9 11.5 - 15.5 %   Platelets 199 150 - 400 K/uL   nRBC 0.0 0.0 - 0.2 %  Type and screen [facility]     Status: None   Collection Time:  05/19/20  7:25 PM  Result Value Ref Range   ABO/RH(D) O POS    Antibody Screen NEG    Sample Expiration      05/22/2020,2359 Performed at [facility], [address] [place], [place] [address]   SARS Coronavirus 2 by RT PCR (hospital order, performed in [facility] hospital lab) Nasopharyngeal Nasopharyngeal Swab     Status: None   Collection Time: 05/19/20  7:27 PM   Specimen: Nasopharyngeal Swab  Result Value Ref Range   SARS Coronavirus 2 NEGATIVE NEGATIVE  ABO/Rh     Status: None (Preliminary result)   Collection Time: 05/19/20  8:00 PM  Result Value Ref Range   ABO/RH(D) PENDING     Pertinent Results:  Prenatal Labs: Blood type/Rh O+  Antibody screen neg  Rubella Immune  Varicella Immune  RPR NR  HBsAg Neg  HIV NR  GC neg  Chlamydia neg  Genetic screening cfDNA negative  1 hour GTT 60  3 hour GTT  n/a  GBS negative   FHT: FHR: 135 bpm, variability: moderate,  accelerations:  Present,  decelerations:  Absent Category/reactivity:  Category I TOCO: irregular, every 6-7 minutes  Assessment:  Rachel Waters is a 24 y.o. G64P0020 female at [redacted]w[redacted]d with PROM.   Plan:  1. Admit to Labor & Delivery; consents reviewed and obtained  2. Fetal Well being  - Fetal Tracing: category I - GBS negative - Presentation: cephalic confirmed by vaginal exam   3. Routine OB: - Prenatal labs reviewed, as above - Rh positive - CBC & T&S on admit - Clear fluids, IVF  4. Augmentation of Labor: -  Contractions monitored with external toco in place -  Plan for augmentation with oxytocin -  Plan for intermittent fetal monitoring now, continuous fetal monitoring once pitocin started -  Maternal pain control as desired: IVPM, nitrous, regional anesthesia - Anticipate vaginal delivery  5. Post Partum Planning: - Infant feeding: breast - Contraception: TBD  Genia Del, CNM 05/19/2020 9:19 PM ----- Genia Del Certified Nurse Midwife [facility], Department of OB/GYN [facility]

## 2020-05-19 NOTE — Plan of Care (Signed)
Plan of care reviewed with patient. All questions answered.

## 2020-05-20 ENCOUNTER — Inpatient Hospital Stay: Payer: Commercial Managed Care - PPO | Admitting: Anesthesiology

## 2020-05-20 ENCOUNTER — Encounter
Admission: EM | Disposition: A | Discharge status: Home / Self Care | Payer: Self-pay | Source: Home / Self Care | Attending: Certified Nurse Midwife

## 2020-05-20 DIAGNOSIS — D62 Acute posthemorrhagic anemia: Secondary | ICD-10-CM | POA: Diagnosis not present

## 2020-05-20 HISTORY — PX: DILATION AND CURETTAGE OF UTERUS: SHX78

## 2020-05-20 SURGERY — DILATION AND CURETTAGE
Anesthesia: Regional | Site: Vagina

## 2020-05-20 MED ORDER — BUPIVACAINE HCL (PF) 0.25 % IJ SOLN
INTRAMUSCULAR | Status: DC | PRN
  Administered 2020-05-20 (×2): 4 mL via EPIDURAL

## 2020-05-20 MED ORDER — FERROUS SULFATE 325 (65 FE) MG PO TABS
325.0000 mg | ORAL_TABLET | Freq: Two times a day (BID) | ORAL | Status: DC
  Filled 2020-05-20: qty 1

## 2020-05-20 MED ORDER — WITCH HAZEL-GLYCERIN EX PADS
1.0000 "application " | MEDICATED_PAD | CUTANEOUS | Status: DC | PRN
  Administered 2020-05-20: 1 via TOPICAL
  Filled 2020-05-20: qty 100

## 2020-05-20 MED ORDER — ONDANSETRON HCL 4 MG PO TABS: 4.0000 mg | ORAL_TABLET | ORAL | Status: DC | PRN

## 2020-05-20 MED ORDER — METHYLERGONOVINE MALEATE 0.2 MG/ML IJ SOLN
INTRAMUSCULAR | Status: AC
  Filled 2020-05-20: qty 1

## 2020-05-20 MED ORDER — DIPHENHYDRAMINE HCL 25 MG PO CAPS: 25.0000 mg | ORAL_CAPSULE | Freq: Four times a day (QID) | ORAL | Status: DC | PRN

## 2020-05-20 MED ORDER — CARBOPROST TROMETHAMINE 250 MCG/ML IM SOLN
INTRAMUSCULAR | Status: AC
  Filled 2020-05-20: qty 1

## 2020-05-20 MED ORDER — LACTATED RINGERS IV SOLN: 500.0000 mL | Freq: Once | INTRAVENOUS | Status: DC

## 2020-05-20 MED ORDER — PHENYLEPHRINE 40 MCG/ML (10ML) SYRINGE FOR IV PUSH (FOR BLOOD PRESSURE SUPPORT): 80.0000 ug | PREFILLED_SYRINGE | INTRAVENOUS | Status: DC | PRN

## 2020-05-20 MED ORDER — LIDOCAINE HCL (PF) 2 % IJ SOLN
INTRAMUSCULAR | Status: AC
  Filled 2020-05-20: qty 10

## 2020-05-20 MED ORDER — SIMETHICONE 80 MG PO CHEW: 80.0000 mg | CHEWABLE_TABLET | ORAL | Status: DC | PRN

## 2020-05-20 MED ORDER — COCONUT OIL OIL
1.0000 "application " | TOPICAL_OIL | Status: DC | PRN
  Administered 2020-05-20: 1 via TOPICAL
  Filled 2020-05-20: qty 120

## 2020-05-20 MED ORDER — MISOPROSTOL 200 MCG PO TABS
ORAL_TABLET | ORAL | Status: AC
  Filled 2020-05-20: qty 4

## 2020-05-20 MED ORDER — ACETAMINOPHEN 325 MG PO TABS: 650.0000 mg | ORAL_TABLET | ORAL | Status: DC | PRN

## 2020-05-20 MED ORDER — FERROUS SULFATE 300 (60 FE) MG/5ML PO SYRP
300.0000 mg | ORAL_SOLUTION | Freq: Every day | ORAL | Status: DC
  Filled 2020-05-20: qty 5

## 2020-05-20 MED ORDER — EPHEDRINE 5 MG/ML INJ: 10.0000 mg | INTRAVENOUS | Status: DC | PRN

## 2020-05-20 MED ORDER — IBUPROFEN 600 MG PO TABS
600.0000 mg | ORAL_TABLET | Freq: Four times a day (QID) | ORAL | Status: DC
  Filled 2020-05-20: qty 1

## 2020-05-20 MED ORDER — DOCUSATE SODIUM 100 MG PO CAPS: 100.0000 mg | ORAL_CAPSULE | Freq: Two times a day (BID) | ORAL | Status: DC

## 2020-05-20 MED ORDER — OXYCODONE HCL 5 MG PO TABS: 5.0000 mg | ORAL_TABLET | ORAL | Status: DC | PRN

## 2020-05-20 MED ORDER — CEFAZOLIN SODIUM-DEXTROSE 1-4 GM/50ML-% IV SOLN
INTRAVENOUS | Status: DC | PRN
  Administered 2020-05-20: 1 g via INTRAVENOUS

## 2020-05-20 MED ORDER — FENTANYL 2.5 MCG/ML W/ROPIVACAINE 0.15% IN NS 100 ML EPIDURAL (ARMC)
EPIDURAL | Status: AC
  Filled 2020-05-20: qty 100

## 2020-05-20 MED ORDER — OXYCODONE HCL 5 MG PO TABS: 10.0000 mg | ORAL_TABLET | ORAL | Status: DC | PRN

## 2020-05-20 MED ORDER — FENTANYL CITRATE (PF) 100 MCG/2ML IJ SOLN: 25.0000 ug | INTRAMUSCULAR | Status: DC | PRN

## 2020-05-20 MED ORDER — ONDANSETRON HCL 4 MG/2ML IJ SOLN
4.0000 mg | INTRAMUSCULAR | Status: DC | PRN
  Administered 2020-05-20: 4 mg via INTRAVENOUS
  Filled 2020-05-20: qty 2

## 2020-05-20 MED ORDER — IBUPROFEN 100 MG/5ML PO SUSP
600.0000 mg | Freq: Four times a day (QID) | ORAL | Status: DC
  Administered 2020-05-20 – 2020-05-22 (×7): 600 mg via ORAL
  Filled 2020-05-20 (×13): qty 30

## 2020-05-20 MED ORDER — FERROUS SULFATE 220 (44 FE) MG/5ML PO ELIX
300.0000 mg | ORAL_SOLUTION | Freq: Every day | ORAL | Status: DC
  Administered 2020-05-21 – 2020-05-22 (×2): 300 mg via ORAL
  Filled 2020-05-20 (×2): qty 6.82
  Filled 2020-05-20: qty 6.9

## 2020-05-20 MED ORDER — MIDAZOLAM HCL 2 MG/2ML IJ SOLN
INTRAMUSCULAR | Status: DC | PRN
  Administered 2020-05-20 (×2): 1 mg via INTRAVENOUS

## 2020-05-20 MED ORDER — LIDOCAINE-EPINEPHRINE (PF) 1.5 %-1:200000 IJ SOLN
INTRAMUSCULAR | Status: DC | PRN
  Administered 2020-05-20: 4 mL via EPIDURAL

## 2020-05-20 MED ORDER — PROPOFOL 10 MG/ML IV BOLUS
INTRAVENOUS | Status: AC
  Filled 2020-05-20: qty 20

## 2020-05-20 MED ORDER — ONDANSETRON HCL 4 MG/2ML IJ SOLN: 4.0000 mg | Freq: Once | INTRAMUSCULAR | Status: DC | PRN

## 2020-05-20 MED ORDER — PRENATAL MULTIVITAMIN CH: 1.0000 | ORAL_TABLET | Freq: Every day | ORAL | Status: DC

## 2020-05-20 MED ORDER — METHYLERGONOVINE MALEATE 0.2 MG/ML IJ SOLN
INTRAMUSCULAR | Status: DC | PRN
Start: 2020-05-20 — End: 2020-05-20
  Administered 2020-05-20: .2 mg via INTRAMUSCULAR

## 2020-05-20 MED ORDER — BENZOCAINE-MENTHOL 20-0.5 % EX AERO
1.0000 "application " | INHALATION_SPRAY | CUTANEOUS | Status: DC | PRN
  Administered 2020-05-20: 1 via TOPICAL
  Filled 2020-05-20 (×2): qty 56

## 2020-05-20 MED ORDER — DIPHENHYDRAMINE HCL 50 MG/ML IJ SOLN: 12.5000 mg | INTRAMUSCULAR | Status: DC | PRN

## 2020-05-20 MED ORDER — PROPOFOL 10 MG/ML IV BOLUS
INTRAVENOUS | Status: DC | PRN
Start: 2020-05-20 — End: 2020-05-20
  Administered 2020-05-20: 30 mg via INTRAVENOUS

## 2020-05-20 MED ORDER — TETANUS-DIPHTH-ACELL PERTUSSIS 5-2.5-18.5 LF-MCG/0.5 IM SUSP: 0.5000 mL | Freq: Once | INTRAMUSCULAR | Status: DC

## 2020-05-20 MED ORDER — FENTANYL 2.5 MCG/ML W/ROPIVACAINE 0.15% IN NS 100 ML EPIDURAL (ARMC)
12.0000 mL/h | EPIDURAL | Status: DC
  Administered 2020-05-20: 12 mL/h via EPIDURAL

## 2020-05-20 MED ORDER — LIDOCAINE HCL (PF) 1 % IJ SOLN
INTRAMUSCULAR | Status: DC | PRN
  Administered 2020-05-20: 3 mL via SUBCUTANEOUS
  Administered 2020-05-20: 10 mL via SUBCUTANEOUS

## 2020-05-20 MED ORDER — DIBUCAINE (PERIANAL) 1 % EX OINT: 1.0000 "application " | TOPICAL_OINTMENT | CUTANEOUS | Status: DC | PRN

## 2020-05-20 MED ORDER — MIDAZOLAM HCL 2 MG/2ML IJ SOLN
INTRAMUSCULAR | Status: AC
  Filled 2020-05-20: qty 2

## 2020-05-20 MED ORDER — ACETAMINOPHEN 160 MG/5ML PO SOLN
325.0000 mg | ORAL | Status: DC | PRN
  Filled 2020-05-20: qty 20.3

## 2020-05-20 MED ORDER — DOCUSATE SODIUM 50 MG/5ML PO LIQD
100.0000 mg | Freq: Every day | ORAL | Status: DC
  Administered 2020-05-20: 100 mg via ORAL
  Filled 2020-05-20 (×3): qty 10

## 2020-05-20 SURGICAL SUPPLY — 25 items
BAG INFUSER PRESSURE 100CC (MISCELLANEOUS) ×3 IMPLANT
CANISTER SUCT 3000ML PPV (MISCELLANEOUS) ×3 IMPLANT
CATH ROBINSON RED A/P 16FR (CATHETERS) IMPLANT
COVER WAND RF STERILE (DRAPES) IMPLANT
DEVICE MYOSURE LITE (MISCELLANEOUS) IMPLANT
DEVICE MYOSURE REACH (MISCELLANEOUS) IMPLANT
GLOVE SURG SYN 8.0 (GLOVE) ×3 IMPLANT
GOWN STRL REUS W/ TWL LRG LVL3 (GOWN DISPOSABLE) ×1 IMPLANT
GOWN STRL REUS W/ TWL XL LVL3 (GOWN DISPOSABLE) ×1 IMPLANT
GOWN STRL REUS W/TWL LRG LVL3 (GOWN DISPOSABLE) ×2
GOWN STRL REUS W/TWL XL LVL3 (GOWN DISPOSABLE) ×2
HANDLE YANKAUER SUCT BULB TIP (MISCELLANEOUS) ×3 IMPLANT
IV NS 1000ML (IV SOLUTION) ×2
IV NS 1000ML BAXH (IV SOLUTION) ×1 IMPLANT
KIT PROCEDURE FLUENT (KITS) ×3 IMPLANT
KIT TURNOVER CYSTO (KITS) ×3 IMPLANT
PACK DNC HYST (MISCELLANEOUS) IMPLANT
PAD OB MATERNITY 4.3X12.25 (PERSONAL CARE ITEMS) ×3 IMPLANT
PAD PREP 24X41 OB/GYN DISP (PERSONAL CARE ITEMS) ×3 IMPLANT
SEAL ROD LENS SCOPE MYOSURE (ABLATOR) ×3 IMPLANT
SOL .9 NS 3000ML IRR  AL (IV SOLUTION) ×2
SOL .9 NS 3000ML IRR UROMATIC (IV SOLUTION) ×1 IMPLANT
TOWEL OR 17X26 4PK STRL BLUE (TOWEL DISPOSABLE) ×3 IMPLANT
TUBING CONNECTING 10 (TUBING) IMPLANT
TUBING CONNECTING 10' (TUBING)

## 2020-05-20 NOTE — Progress Notes (Signed)
Labor Progress Note  Rachel Waters is a 24 y.o. G3P0020 at [redacted]w[redacted]d by [redacted]w[redacted]d ultrasound admitted for rupture of membranes.   Subjective:  Comfortable with epidural.   Objective: BP 103/66   Pulse 77   Temp 98.4 F (36.9 C) (Oral)   Resp 16   Ht 5' (1.524 m)   Wt 59.4 kg   LMP 07/28/2019 (Exact Date)   SpO2 98%   BMI 25.58 kg/m    Fetal Assessment: FHT:  FHR: 125 bpm, variability: moderate,  accelerations:  Present,  decelerations:  Absent Category/reactivity:  Category I UC:   regular, every 2 minutes SVE:    Dilation: 10cm  Effacement: 100%  Station:  +2  Membrane status: SROM 1820 Amniotic color: clear  Labs: Lab Results  Component Value Date   WBC 8.0 05/19/2020   HGB 12.2 05/19/2020   HCT 36.7 05/19/2020   MCV 84.2 05/19/2020   PLT 199 05/19/2020    Assessment / Plan: Augmentation of labor, progressing well  Labor: Pitocin started at 2257, currently infusing at 49mu/min. Complete now, patient not feeling any pressure. Epidural rate reduced from 12 to 8 in hopes of improving ability to push.  Fetal Wellbeing:  Category I Pain Control:  Epidural Anticipated MOD:  NSVD  Genia Del, CNM 05/20/2020, 7:11 AM

## 2020-05-20 NOTE — Lactation Note (Signed)
This note was copied from a baby's chart. Lactation Consultation Note  Patient Name: Rachel Waters Date: 05/20/2020   Assisted mom in comfortable position with pillow support with Dorene Grebe in cradle hold skin to skin.  Demonstrated how to easily hand express lots of colostrum.  She latched with minimal assistance and began strong rhythmic sucking with occasional audible swallows for 18 minutes on right breast. Left nipple is not as well everted as right and Dorene Grebe struggled to maintain the latch.  Kept re latching and sucking for 3 to 4 minutes.  New parents had lots of questions which were addressed.  Mom requesting pump and pacifier.  Mom has [employer].  She did not know she could get a pump through her insurance.  Process explained.  Set up [employer] DEBP with instructions in breast massage, hand expression, pumping, collection, storage, labeling, cleaning and handling of expressed milk.  Mom expressed 13 ml colostrum which she gave to [place] later via bottle and then put her to the breast afterward.  Left breast was more everted after pumping.  Discouraged mom from giving pacifier explaining the risks to successful breast feeding.  Discussed feeding cues and encouraged mom to put Dorene Grebe to the breast whenever she demonstrated hunger cues to bring in mature milk, ensure a plentiful milk supply and prevent engorgement.  Mom reports tender nipple, but no severe pain in nipples or breast.  Discussed hand expression of colostrum after breast feeding to rub on nipples to prevent bacteria, lubricate and help to prevent soreness.  Coconut oil given with instructions in use.  Lactation name and number written on white board and encouraged to call with any questions, concerns or assistance.    Maternal Data    Feeding Feeding Type: Breast Fed Nipple Type: Slow - flow  LATCH Score                   Interventions    Lactation Tools Discussed/Used Tools: Pump   Consult  Status      Louis Meckel 05/20/2020, 7:08 PM

## 2020-05-20 NOTE — Brief Op Note (Signed)
05/20/2020  11:02 AM  PATIENT:  Rachel Waters  24 y.o. female  PRE-OPERATIVE DIAGNOSIS:  retained placenta  POST-OPERATIVE DIAGNOSIS:  retained placenta  PROCEDURE:  Manual extraction of placenta  Banjo curettage  SURGEON:  Surgeon(s) and Role:    * Desiray Orchard, Ihor Austin, MD - Primary  PHYSICIAN ASSISTANT: jennifer oxley , CNM   ASSISTANTS: none   ANESTHESIA:   epidural  EBL:  300 cc  IOF 200 cc with Pitocin in bag   BLOOD ADMINISTERED:none  DRAINS: none   LOCAL MEDICATIONS USED:  NONE  SPECIMEN:  Source of Specimen:  placenta   DISPOSITION OF SPECIMEN:  PATHOLOGY  COUNTS:  YES  TOURNIQUET:  * No tourniquets in log *  DICTATION: .Other Dictation: Dictation Number verbal  PLAN OF CARE: Admit to inpatient   PATIENT DISPOSITION:  PACU - hemodynamically stable.   Delay start of Pharmacological VTE agent (>24hrs) due to surgical blood loss or risk of bleeding: not applicable

## 2020-05-20 NOTE — Plan of Care (Signed)
Alert and oriented with quiet affect. Color good,, skin w&d. BBS clear. Fundus is Firm and Lochia is small in amount. Small Hematoma noted to Right Labia Minora. Pt. Denies pain at sight. Instructed in Fall prevention, POC and v/o.

## 2020-05-20 NOTE — Progress Notes (Signed)
15  Minute call to floor.

## 2020-05-20 NOTE — Anesthesia Preprocedure Evaluation (Addendum)

## 2020-05-20 NOTE — Discharge Summary (Signed)
Obstetrical Discharge Summary  Patient Name: Rachel Waters DOB: [birth year] MRN: [id number]  Date of Admission: 05/19/2020 Date of Delivery: 05/20/20 Delivered by: Linda Hedges Date of Discharge: 05/22/2020  Primary OB: [facility] [id number] last menstrual period was 07/28/2019 (exact date). EDC Estimated Date of Delivery: 05/17/20 Gestational Age at Delivery: [redacted]w[redacted]d   Antepartum complications:  1. Transfer of care from [facility] at [redacted]w[redacted]d 2. Perinatal depression, plan SW consult in hospital 3. S<D 02/24/2020, 04/19/2020 growth ultrasound EFW 2658g 5lb14oz 35% Admitting Diagnosis: DFM and contractions Secondary Diagnosis: Patient Active Problem List   Diagnosis Date Noted  . NSVD (normal spontaneous vaginal delivery) 05/20/2020  . Retained placenta or membranes 05/20/2020  . Acute blood loss anemia 05/20/2020  . Lower abdominal pain 04/22/2020  . History of miscarriage 09/20/2019  . History of abortion 05/24/2019    Augmentation: Pitocin Complications: None  Intrapartum complications/course:  Delivery Type: spontaneous vaginal delivery Anesthesia: epidural Placenta: Manual removal in OR Laceration: None Episiotomy: none Newborn Data: Live born female  Birth Weight: 7 lb 0.9 oz (3200 g) APGAR: 9, 9 "[name]"  Newborn Delivery   Birth date/time: 05/20/2020 09:05:00 Delivery type: Vaginal, Spontaneous      24yo G4P1021 at 39+4wks presenting with DFM and contractions, SROM with clear fluid.  She progressed to complete and pushed over an intact perineum and delivered the fetal head, followed promptly by the shoulders. She was in control the whole time, and the baby placed on the maternal abdomen. Delayed cord clamping and the FOB cut his cord, while he was skin to skin. Cytotec 826mcg PR given for bleeding prophylaxis.  Placenta not delivered after 30 mins, manual removal attempted, then Dr. Ouida Sills called to come in.  Pt taken to OR for placenta  removal, see OR note.  No lacerations. Mom and baby tolerated the delivery well.   Postpartum Procedures: none  Post partum course:  Patient had an uncomplicated postpartum course.  By time of discharge on PPD#2, her pain was controlled on oral pain medications; she had appropriate lochia and was ambulating, voiding without difficulty and tolerating regular diet.  She was deemed stable for discharge to home.    Discharge Physical Exam:  BP 109/70 (BP Location: Left Arm)   Pulse 79   Temp 98 F (36.7 C)   Resp 18   Ht 5' (1.524 m)   Wt 59.4 kg   LMP 07/28/2019 (Exact Date)   SpO2 99%   BMI 25.58 kg/m   General: alert and no distress Pulm: normal respiratory effort Lochia: appropriate Abdomen: soft, NT Uterine Fundus: firm, below umbilicus Extremities: No evidence of DVT seen on physical exam. No lower extremity edema. Edinburgh:  Edinburgh Postnatal Depression Scale Screening Tool 05/21/2020 05/20/2020  I have been able to laugh and see the funny side of things. 0 (No Data)  I have looked forward with enjoyment to things. 1 -  I have blamed myself unnecessarily when things went wrong. 0 -  I have been anxious or worried for no good reason. 1 -  I have felt scared or panicky for no good reason. 0 -  Things have been getting on top of me. 1 -  I have been so unhappy that I have had difficulty sleeping. 1 -  I have felt sad or miserable. 0 -  I have been so unhappy that I have been crying. 0 -  The thought of harming myself has occurred to me. 0 -  Edinburgh Postnatal Depression Scale Total 4 -  Labs: CBC Latest Ref Rng & Units 05/21/2020 05/19/2020  WBC 4.0 - 10.5 K/uL 11.6(H) 8.0  Hemoglobin 12.0 - 15.0 g/dL 8.4(Z) 66.0  Hematocrit 36.0 - 46.0 % 27.3(L) 36.7  Platelets 150 - 400 K/uL 185 199   O POS Performed at [facility], [address] [place], [place] [address]  Hemoglobin  Date Value Ref Range Status  05/21/2020 8.8 (L) 12.0 - 15.0 g/dL Final    HCT  Date Value Ref Range Status  05/21/2020 27.3 (L) 36.0 - 46.0 % Final    Disposition: stable, discharge to home Baby Feeding: breastmilk Baby Disposition: home with mom  Contraception: TBD  Prenatal Labs:  Blood type/Rh O pos  Antibody screen neg  Rubella Immune  Varicella Immune  RPR NR  HBsAg Neg  HIV NR  GC neg  Chlamydia neg  Genetic screening cfDNA negative  1 hour GTT 60  3 hour GTT n/a  GBS neg   Rh Immune globulin given: n/a Rubella vaccine given: n/a Varicella vaccine given: n/a Tdap vaccine given in AP or PP setting: given 03/09/20 Flu vaccine given in AP or PP setting: given 12/07/19  Plan: Darrick Grinder was discharged to home in good condition. Follow-up appointment with delivering provider in 6 weeks.  Discharge Instructions: Per After Visit Summary. Activity: Advance as tolerated. Pelvic rest for 6 weeks.   Diet: Regular Discharge Medications: Allergies as of 05/22/2020   No Known Allergies     Medication List    TAKE these medications   acetaminophen 160 MG/5ML solution Commonly known as: TYLENOL Take 10.2 mLs (325 mg total) by mouth every 4 (four) hours as needed for mild pain, headache or fever.   docusate 50 MG/5ML liquid Commonly known as: COLACE Take 10 mLs (100 mg total) by mouth daily.   ferrous sulfate 220 (44 Fe) MG/5ML solution Take 6.8 mLs (300 mg total) by mouth daily with breakfast.   ibuprofen 100 MG/5ML suspension Commonly known as: ADVIL Take 30 mLs (600 mg total) by mouth every 6 (six) hours.   multivitamin-prenatal 27-0.8 MG Tabs tablet Take 1 tablet by mouth daily at 12 noon.      Outpatient follow up:  Follow-up Information    Haroldine Laws, CNM. Schedule an appointment as soon as possible for a visit in 2 week(s).   Specialty: Certified Nurse Midwife Why: for mood check Contact information: [address] [place] [address] [phone]           Signed: Quillian Quince 05/22/2020 9:48 AM

## 2020-05-20 NOTE — Plan of Care (Signed)
Vaginal delivery at 0905 viable female.

## 2020-05-20 NOTE — Progress Notes (Signed)
Note copied from infant's chart:  RN entered the room and infant's face was covered with blanket while parents were resting. RN discussed safe sleep with parents and demonstrated proper swaddling. Parents verbalized understanding of teaching.   Breastfeeding basics also reviewed. LC consulted upon admission to unit as mom does not have much knowledge about breastfeeding and had a lot of questions. Patient to call for help when ready for feeding.  RN will continue to monitor.

## 2020-05-20 NOTE — Transfer of Care (Signed)
Immediate Anesthesia Transfer of Care Note  Patient: Rachel Waters  Procedure(s) Performed: DILATATION AND CURETTAGE (N/A Vagina )  Patient Location: PACU  Anesthesia Type:Regional  Level of Consciousness: sedated  Airway & Oxygen Therapy: Patient Spontanous Breathing  Post-op Assessment: Report given to RN  Post vital signs: Reviewed and stable  Last Vitals:  Vitals Value Taken Time  BP    Temp    Pulse 86 05/20/20 1059  Resp 18 05/20/20 1059  SpO2 99 % 05/20/20 1059  Vitals shown include unvalidated device data.  Last Pain:  Vitals:   05/20/20 0753  TempSrc:   PainSc: 5          Complications: No apparent anesthesia complications

## 2020-05-20 NOTE — Op Note (Signed)
NAME: Rachel Waters, Rachel Waters MEDICAL RECORD [id number] ACCOUNT [id number] DATE OF BIRTH:12-31-1995 FACILITY: [facility] LOCATION: [facility] PHYSICIAN:Jahziah Simonin Cloyde Reams, MD  OPERATIVE REPORT  DATE OF PROCEDURE:  05/20/2020  PREOPERATIVE DIAGNOSES:  Status post spontaneous vaginal delivery with retained placenta.  POSTOPERATIVE DIAGNOSES:  Status post spontaneous vaginal delivery with retained placenta.  PROCEDURES:   1.  Manual extraction of placenta. 2.  Banjo endometrial curettage.  SURGEON:  Jennell Corner, MD  FIRST ASSISTANT:  Haroldine Laws, certified nurse midwife.  ANESTHESIA:  Continuous lumbar epidural.  INDICATIONS:  A 24 year old female, status post spontaneous vaginal delivery with a retained placenta that could not be removed in the delivery suite.  The patient without prior endometrial surgery and denies prior infection.  DESCRIPTION OF PROCEDURE:  After adequate continuous lumbar epidural, the patient was dosed with additional IV sedation.  Legs were placed in the candy cane stirrups.  Lower abdomen, perineal and vaginal prep was performed with Betadine.  Timeout was  performed.  A hand was placed through the vagina into the endometrium where a fundal placental unit was identified and teased out with some difficulty.  The placenta was removed fragmented, but intact completely.  Repeat manual examination revealed no  additional fragments.  A banjo curetting was then performed with no additional tissue.  This was aided by an abdominal hand with the uterus palpated in its entirety.  A repeat manual exam again documented no additional tissue.  Visualization of the  cervix revealed no lacerations and no trailing membranes.  Good hemostasis was noted.  The patient was given 0.2 mg intramuscular Methergine after extraction to aid in uterine tone.  The patient was administered intravenous Pitocin during the procedure  as well.  COMPLICATIONS:  There were no  complications.  ESTIMATED BLOOD LOSS:  300 mL.  INTRAOPERATIVE FLUIDS:   200 mL with Pitocin.  DISPOSITION:  The patient was taken to recovery room in good condition.  VN/NUANCE  D:05/20/2020 T:05/20/2020 JOB:[id number]

## 2020-05-20 NOTE — Progress Notes (Signed)
Labor Progress Note  Rachel Waters is a 24 y.o. G3P0020 at [redacted]w[redacted]d by ultrasound admitted for rupture of membranes  Subjective: Assumed care. Pt is comfortable, she is not feeling her UCs at all.   Objective: BP 103/66   Pulse 77   Temp 98.9 F (37.2 C) (Oral)   Resp 16   Ht 5' (1.524 m)   Wt 59.4 kg   LMP 07/28/2019 (Exact Date)   SpO2 98%   BMI 25.58 kg/m   Fetal Assessment: FHT:  FHR: 130 bpm, variability: moderate,  accelerations:  Present,  decelerations:  Absent Category/reactivity:  Category I UC:   regular, every 2-3 minutes SVE:    Dilation: 10 cm   Effacement: 100%  Station:  +2  Consistency: n/a  Position: n/a  Membrane status: SROM @ 1820 Amniotic color: clear  Labs: Lab Results  Component Value Date   WBC 8.0 05/19/2020   HGB 12.2 05/19/2020   HCT 36.7 05/19/2020   MCV 84.2 05/19/2020   PLT 199 05/19/2020    Assessment / Plan: Augmentation of labor, progressing well  Labor: Progressing normally Preeclampsia:  103/66 Fetal Wellbeing:  Category I Pain Control:  Epidural I/D:  GBS neg, SROM x 14 hours Anticipated MOD:  NSVD  Jenifer E Ayaan Shutes, CNM 05/20/2020, 8:44 AM

## 2020-05-20 NOTE — Anesthesia Procedure Notes (Signed)
Epidural Patient location during procedure: OB  Staffing Performed: anesthesiologist   Preanesthetic Checklist Completed: patient identified, IV checked, site marked, risks and benefits discussed, surgical consent, monitors and equipment checked, pre-op evaluation and timeout performed  Epidural Patient position: sitting Prep: Betadine Patient monitoring: heart rate, continuous pulse ox and blood pressure Approach: midline Location: L4-L5 Injection technique: LOR saline  Needle:  Needle type: Tuohy  Needle gauge: 17 G Needle length: 9 cm and 9 Needle insertion depth: 5 cm Catheter type: closed end flexible Catheter size: 19 Gauge Catheter at skin depth: 10 cm Test dose: negative and 1.5% lidocaine with Epi 1:200 K  Assessment Sensory level: T10 Events: blood not aspirated, injection not painful, no injection resistance, no paresthesia and negative IV test  Additional Notes   Patient tolerated the insertion well without complications.-SATD -IVTD. No paresthesia. Refer to [facility] nursing for VS and dosingReason for block:procedure for pain     

## 2020-05-20 NOTE — Discharge Instructions (Signed)
Postpartum Care After Vaginal Delivery °This sheet gives you information about how to care for yourself from the time you deliver your baby to up to 6-12 weeks after delivery (postpartum period). Your health care provider may also give you more specific instructions. If you have problems or questions, contact your health care provider. °Follow these instructions at home: °Vaginal bleeding °· It is normal to have vaginal bleeding (lochia) after delivery. Wear a sanitary pad for vaginal bleeding and discharge. °? During the first week after delivery, the amount and appearance of lochia is often similar to a menstrual period. °? Over the next few weeks, it will gradually decrease to a dry, yellow-brown discharge. °? For most women, lochia stops completely by 4-6 weeks after delivery. Vaginal bleeding can vary from woman to woman. °· Change your sanitary pads frequently. Watch for any changes in your flow, such as: °? A sudden increase in volume. °? A change in color. °? Large blood clots. °· If you pass a blood clot from your vagina, save it and call your health care provider to discuss. Do not flush blood clots down the toilet before talking with your health care provider. °· Do not use tampons or douches until your health care provider says this is safe. °· If you are not breastfeeding, your period should return 6-8 weeks after delivery. If you are feeding your child breast milk only (exclusive breastfeeding), your period may not return until you stop breastfeeding. °Perineal care °· Keep the area between the vagina and the anus (perineum) clean and dry as told by your health care provider. Use medicated pads and pain-relieving sprays and creams as directed. °· If you had a cut in the perineum (episiotomy) or a tear in the vagina, check the area for signs of infection until you are healed. Check for: °? More redness, swelling, or pain. °? Fluid or blood coming from the cut or tear. °? Warmth. °? Pus or a bad  smell. °· You may be given a squirt bottle to use instead of wiping to clean the perineum area after you go to the bathroom. As you start healing, you may use the squirt bottle before wiping yourself. Make sure to wipe gently. °· To relieve pain caused by an episiotomy, a tear in the vagina, or swollen veins in the anus (hemorrhoids), try taking a warm sitz bath 2-3 times a day. A sitz bath is a warm water bath that is taken while you are sitting down. The water should only come up to your hips and should cover your buttocks. °Breast care °· Within the first few days after delivery, your breasts may feel heavy, full, and uncomfortable (breast engorgement). Milk may also leak from your breasts. Your health care provider can suggest ways to help relieve the discomfort. Breast engorgement should go away within a few days. °· If you are breastfeeding: °? Wear a bra that supports your breasts and fits you well. °? Keep your nipples clean and dry. Apply creams and ointments as told by your health care provider. °? You may need to use breast pads to absorb milk that leaks from your breasts. °? You may have uterine contractions every time you breastfeed for up to several weeks after delivery. Uterine contractions help your uterus return to its normal size. °? If you have any problems with breastfeeding, work with your health care provider or lactation consultant. °· If you are not breastfeeding: °? Avoid touching your breasts a lot. Doing this can make   your breasts produce more milk. °? Wear a good-fitting bra and use cold packs to help with swelling. °? Do not squeeze out (express) milk. This causes you to make more milk. °Intimacy and sexuality °· Ask your health care provider when you can engage in sexual activity. This may depend on: °? Your risk of infection. °? How fast you are healing. °? Your comfort and desire to engage in sexual activity. °· You are able to get pregnant after delivery, even if you have not had  your period. If desired, talk with your health care provider about methods of birth control (contraception). °Medicines °· Take over-the-counter and prescription medicines only as told by your health care provider. °· If you were prescribed an antibiotic medicine, take it as told by your health care provider. Do not stop taking the antibiotic even if you start to feel better. °Activity °· Gradually return to your normal activities as told by your health care provider. Ask your health care provider what activities are safe for you. °· Rest as much as possible. Try to rest or take a nap while your baby is sleeping. °Eating and drinking ° °· Drink enough fluid to keep your urine pale yellow. °· Eat high-fiber foods every day. These may help prevent or relieve constipation. High-fiber foods include: °? Whole grain cereals and breads. °? Brown rice. °? Beans. °? Fresh fruits and vegetables. °· Do not try to lose weight quickly by cutting back on calories. °· Take your prenatal vitamins until your postpartum checkup or until your health care provider tells you it is okay to stop. °Lifestyle °· Do not use any products that contain nicotine or tobacco, such as cigarettes and e-cigarettes. If you need help quitting, ask your health care provider. °· Do not drink alcohol, especially if you are breastfeeding. °General instructions °· Keep all follow-up visits for you and your baby as told by your health care provider. Most women visit their health care provider for a postpartum checkup within the first 3-6 weeks after delivery. °Contact a health care provider if: °· You feel unable to cope with the changes that your child brings to your life, and these feelings do not go away. °· You feel unusually sad or worried. °· Your breasts become red, painful, or hard. °· You have a fever. °· You have trouble holding urine or keeping urine from leaking. °· You have little or no interest in activities you used to enjoy. °· You have not  breastfed at all and you have not had a menstrual period for 12 weeks after delivery. °· You have stopped breastfeeding and you have not had a menstrual period for 12 weeks after you stopped breastfeeding. °· You have questions about caring for yourself or your baby. °· You pass a blood clot from your vagina. °Get help right away if: °· You have chest pain. °· You have difficulty breathing. °· You have sudden, severe leg pain. °· You have severe pain or cramping in your lower abdomen. °· You bleed from your vagina so much that you fill more than one sanitary pad in one hour. Bleeding should not be heavier than your heaviest period. °· You develop a severe headache. °· You faint. °· You have blurred vision or spots in your vision. °· You have bad-smelling vaginal discharge. °· You have thoughts about hurting yourself or your baby. °If you ever feel like you may hurt yourself or others, or have thoughts about taking your own life, get help   right away. You can go to the nearest emergency department or call: °· Your local emergency services (911 in the [place] °· A suicide crisis helpline, such as the National Suicide Prevention Lifeline at 1-800-273-8255. This is open 24 hours a day. °Summary °· The period of time right after you deliver your newborn up to 6-12 weeks after delivery is called the postpartum period. °· Gradually return to your normal activities as told by your health care provider. °· Keep all follow-up visits for you and your baby as told by your health care provider. °This information is not intended to replace advice given to you by your health care provider. Make sure you discuss any questions you have with your health care provider. °Document Revised: 12/12/2017 Document Reviewed: 09/22/2017 °[employer] Patient Education © 2020 [employer] ° ° °Postpartum Baby Blues °The postpartum period begins right after the birth of a baby. During this time, there is often a lot of joy and excitement. It is also  a time of many changes in the life of the parents. No matter how many times a mother gives birth, each child brings new challenges to the family, including different ways of relating to one another. °It is common to have feelings of excitement along with confusing changes in moods, emotions, and thoughts. You may feel happy one minute and sad or stressed the next. These feelings of sadness usually happen in the period right after you have your baby, and they go away within a week or two. This is called the "baby blues." °What are the causes? °There is no known cause of baby blues. It is likely caused by a combination of factors. However, changes in hormone levels after childbirth are believed to trigger some of the symptoms. °Other factors that can play a role in these mood changes include: °· Lack of sleep. °· Stressful life events, such as poverty, caring for a loved one, or death of a loved one. °· Genetics. °What are the signs or symptoms? °Symptoms of this condition include: °· Brief changes in mood, such as going from extreme happiness to sadness. °· Decreased concentration. °· Difficulty sleeping. °· Crying spells and tearfulness. °· Loss of appetite. °· Irritability. °· Anxiety. °If the symptoms of baby blues last for more than 2 weeks or become more severe, you may have postpartum depression. °How is this diagnosed? °This condition is diagnosed based on an evaluation of your symptoms. There are no medical or lab tests that lead to a diagnosis, but there are various questionnaires that a health care provider may use to identify women with the baby blues or postpartum depression. °How is this treated? °Treatment is not needed for this condition. The baby blues usually go away on their own in 1-2 weeks. Social support is often all that is needed. You will be encouraged to get adequate sleep and rest. °Follow these instructions at home: °Lifestyle ° °  ° °· Get as much rest as you can. Take a nap when the baby  sleeps. °· Exercise regularly as told by your health care provider. Some women find yoga and walking to be helpful. °· Eat a balanced and nourishing diet. This includes plenty of fruits and vegetables, whole grains, and lean proteins. °· Do little things that you enjoy. Have a cup of tea, take a bubble bath, read your favorite magazine, or listen to your favorite music. °· Avoid alcohol. °· Ask for help with household chores, cooking, grocery shopping, or running errands. Do not try   to do everything yourself. Consider hiring a postpartum doula to help. This is a professional who specializes in providing support to new mothers. °· Try not to make any major life changes during pregnancy or right after giving birth. This can add stress. °General instructions °· Talk to people close to you about how you are feeling. Get support from your partner, family members, friends, or other new moms. You may want to join a support group. °· Find ways to cope with stress. This may include: °? Writing your thoughts and feelings in a journal. °? Spending time outside. °? Spending time with people who make you laugh. °· Try to stay positive in how you think. Think about the things you are grateful for. °· Take over-the-counter and prescription medicines only as told by your health care provider. °· Let your health care provider know if you have any concerns. °· Keep all postpartum visits as told by your health care provider. This is important. °Contact a health care provider if: °· Your baby blues do not go away after 2 weeks. °Get help right away if: °· You have thoughts of taking your own life (suicidal thoughts). °· You think you may harm the baby or other people. °· You see or hear things that are not there (hallucinations). °Summary °· After giving birth, you may feel happy one minute and sad or stressed the next. Feelings of sadness that happen right after the baby is born and go away after a week or two are called the "baby  blues." °· You can manage the baby blues by getting enough rest, eating a healthy diet, exercising, spending time with supportive people, and finding ways to cope with stress. °· If feelings of sadness and stress last longer than 2 weeks or get in the way of caring for your baby, talk to your health care provider. This may mean you have postpartum depression. °This information is not intended to replace advice given to you by your health care provider. Make sure you discuss any questions you have with your health care provider. °Document Revised: 04/02/2019 Document Reviewed: 02/04/2017 °[employer]vier Patient Education © 2020 [employer] ° ° °Breastfeeding ° °Choosing to breastfeed is one of the best decisions you can make for yourself and your baby. A change in hormones during pregnancy causes your breasts to make breast milk in your milk-producing glands. Hormones prevent breast milk from being released before your baby is born. They also prompt milk flow after birth. Once breastfeeding has begun, thoughts of your baby, as well as his or her sucking or crying, can stimulate the release of milk from your milk-producing glands. °Benefits of breastfeeding °Research shows that breastfeeding offers many health benefits for infants and mothers. It also offers a cost-free and convenient way to feed your baby. °For your baby °· Your first milk (colostrum) helps your baby's digestive system to function better. °· Special cells in your milk (antibodies) help your baby to fight off infections. °· Breastfed babies are less likely to develop asthma, allergies, obesity, or type 2 diabetes. They are also at lower risk for sudden infant death syndrome (SIDS). °· Nutrients in breast milk are better able to meet your baby’s needs compared to infant formula. °· Breast milk improves your baby's brain development. °For you °· Breastfeeding helps to create a very special bond between you and your baby. °· Breastfeeding is convenient.  Breast milk costs nothing and is always available at the correct temperature. °· Breastfeeding helps to burn   calories. It helps you to lose the weight that you gained during pregnancy. °· Breastfeeding makes your uterus return faster to its size before pregnancy. It also slows bleeding (lochia) after you give birth. °· Breastfeeding helps to lower your risk of developing type 2 diabetes, osteoporosis, rheumatoid arthritis, cardiovascular disease, and breast, ovarian, uterine, and endometrial cancer later in life. °Breastfeeding basics °Starting breastfeeding °· Find a comfortable place to sit or lie down, with your neck and back well-supported. °· Place a pillow or a rolled-up blanket under your baby to bring him or her to the level of your breast (if you are seated). Nursing pillows are specially designed to help support your arms and your baby while you breastfeed. °· Make sure that your baby's tummy (abdomen) is facing your abdomen. °· Gently massage your breast. With your fingertips, massage from the outer edges of your breast inward toward the nipple. This encourages milk flow. If your milk flows slowly, you may need to continue this action during the feeding. °· Support your breast with 4 fingers underneath and your thumb above your nipple (make the letter "C" with your hand). Make sure your fingers are well away from your nipple and your baby’s mouth. °· Stroke your baby's lips gently with your finger or nipple. °· When your baby's mouth is open wide enough, quickly bring your baby to your breast, placing your entire nipple and as much of the areola as possible into your baby's mouth. The areola is the colored area around your nipple. °? More areola should be visible above your baby's upper lip than below the lower lip. °? Your baby's lips should be opened and extended outward (flanged) to ensure an adequate, comfortable latch. °? Your baby's tongue should be between his or her lower gum and your  breast. °· Make sure that your baby's mouth is correctly positioned around your nipple (latched). Your baby's lips should create a seal on your breast and be turned out (everted). °· It is common for your baby to suck about 2-3 minutes in order to start the flow of breast milk. °Latching °Teaching your baby how to latch onto your breast properly is very important. An improper latch can cause nipple pain, decreased milk supply, and poor weight gain in your baby. Also, if your baby is not latched onto your nipple properly, he or she may swallow some air during feeding. This can make your baby fussy. Burping your baby when you switch breasts during the feeding can help to get rid of the air. However, teaching your baby to latch on properly is still the best way to prevent fussiness from swallowing air while breastfeeding. °Signs that your baby has successfully latched onto your nipple °· Silent tugging or silent sucking, without causing you pain. Infant's lips should be extended outward (flanged). °· Swallowing heard between every 3-4 sucks once your milk has started to flow (after your let-down milk reflex occurs). °· Muscle movement above and in front of his or her ears while sucking. °Signs that your baby has not successfully latched onto your nipple °· Sucking sounds or smacking sounds from your baby while breastfeeding. °· Nipple pain. °If you think your baby has not latched on correctly, slip your finger into the corner of your baby’s mouth to break the suction and place it between your baby's gums. Attempt to start breastfeeding again. °Signs of successful breastfeeding °Signs from your baby °· Your baby will gradually decrease the number of sucks or will completely stop   sucking. °· Your baby will fall asleep. °· Your baby's body will relax. °· Your baby will retain a small amount of milk in his or her mouth. °· Your baby will let go of your breast by himself or herself. °Signs from you °· Breasts that have  increased in firmness, weight, and size 1-3 hours after feeding. °· Breasts that are softer immediately after breastfeeding. °· Increased milk volume, as well as a change in milk consistency and color by the fifth day of breastfeeding. °· Nipples that are not sore, cracked, or bleeding. °Signs that your baby is getting enough milk °· Wetting at least 1-2 diapers during the first 24 hours after birth. °· Wetting at least 5-6 diapers every 24 hours for the first week after birth. The urine should be clear or pale yellow by the age of 5 days. °· Wetting 6-8 diapers every 24 hours as your baby continues to grow and develop. °· At least 3 stools in a 24-hour period by the age of 5 days. The stool should be soft and yellow. °· At least 3 stools in a 24-hour period by the age of 7 days. The stool should be seedy and yellow. °· No loss of weight greater than 10% of birth weight during the first 3 days of life. °· Average weight gain of 4-7 oz (113-198 g) per week after the age of 4 days. °· Consistent daily weight gain by the age of 5 days, without weight loss after the age of 2 weeks. °After a feeding, your baby may spit up a small amount of milk. This is normal. °Breastfeeding frequency and duration °Frequent feeding will help you make more milk and can prevent sore nipples and extremely full breasts (breast engorgement). Breastfeed when you feel the need to reduce the fullness of your breasts or when your baby shows signs of hunger. This is called "breastfeeding on demand." Signs that your baby is hungry include: °· Increased alertness, activity, or restlessness. °· Movement of the head from side to side. °· Opening of the mouth when the corner of the mouth or cheek is stroked (rooting). °· Increased sucking sounds, smacking lips, cooing, sighing, or squeaking. °· Hand-to-mouth movements and sucking on fingers or hands. °· Fussing or crying. °Avoid introducing a pacifier to your baby in the first 4-6 weeks after your  baby is born. After this time, you may choose to use a pacifier. Research has shown that pacifier use during the first year of a baby's life decreases the risk of sudden infant death syndrome (SIDS). °Allow your baby to feed on each breast as long as he or she wants. When your baby unlatches or falls asleep while feeding from the first breast, offer the second breast. Because newborns are often sleepy in the first few weeks of life, you may need to awaken your baby to get him or her to feed. °Breastfeeding times will vary from baby to baby. However, the following rules can serve as a guide to help you make sure that your baby is properly fed: °· Newborns (babies 4 weeks of age or younger) may breastfeed every 1-3 hours. °· Newborns should not go without breastfeeding for longer than 3 hours during the day or 5 hours during the night. °· You should breastfeed your baby a minimum of 8 times in a 24-hour period. °Breast milk pumping ° °  ° °Pumping and storing breast milk allows you to make sure that your baby is exclusively fed your breast milk,   milk, even at times when you are unable to breastfeed. This is especially important if you go back to work while you are still breastfeeding, or if you are not able to be present during feedings. Your lactation consultant can help you find a method of pumping that works best for you and give you guidelines about how long it is safe to store breast milk. Caring for your breasts while you breastfeed Nipples can become dry, cracked, and sore while breastfeeding. The following recommendations can help keep your breasts moisturized and healthy:  Avoid using soap on your nipples.  Wear a supportive bra designed especially for nursing. Avoid wearing underwire-style bras or extremely tight bras (sports bras).  Air-dry your nipples for 3-4 minutes after each feeding.  Use only cotton bra pads to absorb leaked breast milk. Leaking of breast milk between feedings is normal.  Use  lanolin on your nipples after breastfeeding. Lanolin helps to maintain your skin's normal moisture barrier. Pure lanolin is not harmful (not toxic) to your baby. You may also hand express a few drops of breast milk and gently massage that milk into your nipples and allow the milk to air-dry. In the first few weeks after giving birth, some women experience breast engorgement. Engorgement can make your breasts feel heavy, warm, and tender to the touch. Engorgement peaks within 3-5 days after you give birth. The following recommendations can help to ease engorgement:  Completely empty your breasts while breastfeeding or pumping. You may want to start by applying warm, moist heat (in the shower or with warm, water-soaked hand towels) just before feeding or pumping. This increases circulation and helps the milk flow. If your baby does not completely empty your breasts while breastfeeding, pump any extra milk after he or she is finished.  Apply ice packs to your breasts immediately after breastfeeding or pumping, unless this is too uncomfortable for you. To do this: ? Put ice in a plastic bag. ? Place a towel between your skin and the bag. ? Leave the ice on for 20 minutes, 2-3 times a day.  Make sure that your baby is latched on and positioned properly while breastfeeding. If engorgement persists after 48 hours of following these recommendations, contact your health care provider or a Advertising copywriter. Overall health care recommendations while breastfeeding  Eat 3 healthy meals and 3 snacks every day. Well-nourished mothers who are breastfeeding need an additional 450-500 calories a day. You can meet this requirement by increasing the amount of a balanced diet that you eat.  Drink enough water to keep your urine pale yellow or clear.  Rest often, relax, and continue to take your prenatal vitamins to prevent fatigue, stress, and low vitamin and mineral levels in your body (nutrient  deficiencies).  Do not use any products that contain nicotine or tobacco, such as cigarettes and e-cigarettes. Your baby may be harmed by chemicals from cigarettes that pass into breast milk and exposure to secondhand smoke. If you need help quitting, ask your health care provider.  Avoid alcohol.  Do not use illegal drugs or marijuana.  Talk with your health care provider before taking any medicines. These include over-the-counter and prescription medicines as well as vitamins and herbal supplements. Some medicines that may be harmful to your baby can pass through breast milk.  It is possible to become pregnant while breastfeeding. If birth control is desired, ask your health care provider about options that will be safe while breastfeeding your baby. Where to find more  information: [employer]: www.llli.org Contact a health care provider if:  You feel like you want to stop breastfeeding or have become frustrated with breastfeeding.  Your nipples are cracked or bleeding.  Your breasts are red, tender, or warm.  You have: ? Painful breasts or nipples. ? A swollen area on either breast. ? A fever or chills. ? Nausea or vomiting. ? Drainage other than breast milk from your nipples.  Your breasts do not become full before feedings by the fifth day after you give birth.  You feel sad and depressed.  Your baby is: ? Too sleepy to eat well. ? Having trouble sleeping. ? More than 26 week old and wetting fewer than 6 diapers in a 24-hour period. ? Not gaining weight by 90 days of age.  Your baby has fewer than 3 stools in a 24-hour period.  Your baby's skin or the white parts of his or her eyes become yellow. Get help right away if:  Your baby is overly tired (lethargic) and does not want to wake up and feed.  Your baby develops an unexplained fever. Summary  Breastfeeding offers many health benefits for infant and mothers.  Try to breastfeed your infant when  he or she shows early signs of hunger.  Gently tickle or stroke your baby's lips with your finger or nipple to allow the baby to open his or her mouth. Bring the baby to your breast. Make sure that much of the areola is in your baby's mouth. Offer one side and burp the baby before you offer the other side.  Talk with your health care provider or lactation consultant if you have questions or you face problems as you breastfeed. This information is not intended to replace advice given to you by your health care provider. Make sure you discuss any questions you have with your health care provider. Document Revised: 03/05/2018 Document Reviewed: 01/10/2017 Elsevier Patient Education  2020 [employer].

## 2020-05-20 NOTE — Progress Notes (Signed)
J. Oxley CNM notified of Right Labia Minora Hematoma tht ice was applied to and site was reassessed appx 1 hour after initial assessment and there was not any increase in size. No new orders received. Will cont. To monitor.

## 2020-05-21 LAB — CBC
HCT: 27.3 % — ABNORMAL LOW (ref 36.0–46.0)
Hemoglobin: 8.8 g/dL — ABNORMAL LOW (ref 12.0–15.0)
MCH: 27.3 pg (ref 26.0–34.0)
MCHC: 32.2 g/dL (ref 30.0–36.0)
MCV: 84.8 fL (ref 80.0–100.0)
Platelets: 185 10*3/uL (ref 150–400)
RBC: 3.22 MIL/uL — ABNORMAL LOW (ref 3.87–5.11)
RDW: 14.2 % (ref 11.5–15.5)
WBC: 11.6 10*3/uL — ABNORMAL HIGH (ref 4.0–10.5)
nRBC: 0 % (ref 0.0–0.2)

## 2020-05-21 LAB — RPR: RPR Ser Ql: NONREACTIVE

## 2020-05-21 NOTE — Progress Notes (Signed)
Upon entering the room, NT found infant sleeping in the bed with patient and FOB. NT put infant back in the bassinet and reinforced safe sleep with the patient and FOB. NT alerted RN. RN to discuss safe sleep again with patient/family.

## 2020-05-21 NOTE — Plan of Care (Signed)
Alert and oriented with pleasant affect. Color good, skin w&d. HGB was 8.8 this A.M.;Pt. denies syncope. Fundus is firm and small amount of Lochia. Demonstrates aprop. Bonding with Infant.

## 2020-05-21 NOTE — Progress Notes (Signed)
Post Partum Day 1  Subjective: Doing well, no concerns. Ambulating without difficulty, pain managed with PO meds, tolerating regular diet, and voiding without difficulty.   No fever/chills, chest pain, shortness of breath, nausea/vomiting, or leg pain. No nipple or breast pain.   Objective: BP 98/61 (BP Location: Right Arm)   Pulse 76   Temp 97.8 F (36.6 C) (Axillary)   Resp 17   Ht 5' (1.524 m)   Wt 59.4 kg   LMP 07/28/2019 (Exact Date)   SpO2 99% Comment: Room Air  BMI 25.58 kg/m    Physical Exam:  General: alert and cooperative Breasts: soft/nontender CV: RRR Pulm: nl effort Abdomen: soft, non-tender Uterine Fundus: firm Incision: n/a Perineum: minimal edema Labia - left: slight swelling, improved overnight Lochia: appropriate DVT Evaluation: No evidence of DVT seen on physical exam. Edinburgh:  Edinburgh Postnatal Depression Scale Screening Tool 05/21/2020 05/20/2020  I have been able to laugh and see the funny side of things. 0 (No Data)  I have looked forward with enjoyment to things. 1 -  I have blamed myself unnecessarily when things went wrong. 0 -  I have been anxious or worried for no good reason. 1 -  I have felt scared or panicky for no good reason. 0 -  Things have been getting on top of me. 1 -  I have been so unhappy that I have had difficulty sleeping. 1 -  I have felt sad or miserable. 0 -  I have been so unhappy that I have been crying. 0 -  The thought of harming myself has occurred to me. 0 -  Edinburgh Postnatal Depression Scale Total 4 -     Recent Labs    05/19/20 1925  HGB 12.2  HCT 36.7  WBC 8.0  PLT 199  AM CBC pending  Assessment/Plan: 24 y.o. G3P0020 postpartum day # 1  -Continue routine postpartum care -Lactation consult PRN for breastfeeding -Immunization status: all immunizations up to date  Disposition: Continue inpatient postpartum care    LOS: 2 days   Jamison Soward, CNM 05/21/2020, 8:05 AM

## 2020-05-21 NOTE — Anesthesia Postprocedure Evaluation (Signed)
Anesthesia Post Note  Patient: Rachel Waters  Procedure(s) Performed: DILATATION AND CURETTAGE (N/A Vagina )  Patient location during evaluation: Mother Baby Anesthesia Type: Regional and Epidural Level of consciousness: awake and alert Pain management: pain level controlled Vital Signs Assessment: post-procedure vital signs reviewed and stable Respiratory status: spontaneous breathing, nonlabored ventilation and respiratory function stable Cardiovascular status: stable Postop Assessment: no headache, no backache and epidural receding Anesthetic complications: no     Last Vitals:  Vitals:   05/21/20 0339 05/21/20 0759  BP: 97/60 98/61  Pulse: 74 76  Resp: 16 17  Temp: 36.4 C 36.6 C  SpO2: 100% 99%    Last Pain:  Vitals:   05/21/20 0759  TempSrc: Axillary  PainSc:                  Lynden Oxford

## 2020-05-21 NOTE — Progress Notes (Signed)
Patient mostly breastfeeding on the right side. States infant doesn't do well on the left side. RN discussed importance of attempting feeds on both sides. Patient to call RN when infant is ready to feed next.

## 2020-05-21 NOTE — Progress Notes (Signed)
Mom requested Formula. Encouraged Mom to Breast feed until Milk supply established. Educated Mom in importance of Establishing effective latch that is sustained and to allow Infant to Nurse as frequently as she desires so that Milk supple will be established. Mom v/o. Reinforced benefits of Colostrum and Breast milk. Also, reinforced that if Mom desired Formula despite the above, Formula would ve provided as requested.

## 2020-05-21 NOTE — Progress Notes (Signed)
Period of Purple Cry video shown to patient and significant other.

## 2020-05-22 MED ORDER — DOCUSATE SODIUM 50 MG/5ML PO LIQD: 100.0000 mg | Freq: Every day | ORAL | Status: DC

## 2020-05-22 MED ORDER — IBUPROFEN 100 MG/5ML PO SUSP: 600.0000 mg | Freq: Four times a day (QID) | ORAL | Status: DC

## 2020-05-22 MED ORDER — ACETAMINOPHEN 160 MG/5ML PO SOLN: 325.0000 mg | ORAL | 0 refills | Status: DC | PRN

## 2020-05-22 MED ORDER — FERROUS SULFATE 220 (44 FE) MG/5ML PO ELIX: 300.0000 mg | ORAL_SOLUTION | Freq: Every day | ORAL | 3 refills | Status: DC

## 2020-05-22 NOTE — Progress Notes (Addendum)
Received return call from [facility] with [facility] DSS. Made CPS report based on RN's concerns about safe sleep.   Provided information from CSW Assessment and RN notes (5/29- blanket over baby's face and safe sleep education provided; 5/30- baby in bed with MOB sleeping and education provided again; 5/30 pm- MOB asked for Baby to be put in bassinet so she could nap which was an improvement in practicing safe sleep). Informed her that this CSW and RN Irving Burton provided education again today on safe sleep as well and no other concerns per RN.  Informed Rachel MOB and Baby to discharge today.   Alfonso Ramus, [place] [phone]

## 2020-05-22 NOTE — Progress Notes (Signed)
Patient discharged home with infant. Discharge instructions, prescriptions and follow up appointment given to and reviewed with patient. Patient verbalized understanding  Heavy discharge teaching done on infant safe sleep, feeding the newborn, newborn safety, postpartum depression. Patient and dad verbalized understanding.  Pt wheeled out with infant by auxiliary.

## 2020-05-22 NOTE — Clinical Social Work Maternal (Signed)
CLINICAL SOCIAL WORK MATERNAL/CHILD NOTE  Patient Details  Name: Rachel Waters MRN: [id number] Date of Birth: 1996/01/28  Date:  05/22/2020  Clinical Social Worker Initiating Note:  Rachel Genin, LCSW Date/Time: Initiated:  05/22/20/      Child's Name:  Rachel Waters   Biological Parents:  Mother, Father   Need for Interpreter:  None   Reason for Referral:  Other (Comment)(RN had concerns about patient understanding/following safe sleep recommendations)   Address:  [address] [place] [address]    Phone number:  [phone] (home)     Additional phone number: None reported.  Household Members/Support Persons (HM/SP):   Household Member/Support Person 1   HM/SP Name Relationship DOB or Age  HM/SP -1 Horris Latino FOB unknown  HM/SP -2        HM/SP -3        HM/SP -4        HM/SP -5        HM/SP -6        HM/SP -7        HM/SP -8          Natural Supports (not living in the home):  Extended Family, Immediate Family, Friends   Medical illustrator Supports:     Employment: Full-time   Type of Work: did not specify when asked, stated that she works with FOB   Education:  [place] arranged:    Museum/gallery curator Resources:      Other Resources:      Cultural/Religious Considerations Which May Impact Care:  None reported.  Strengths:  Ability to meet basic needs , Home prepared for child , Pediatrician chosen   Psychotropic Medications:         Pediatrician:    [facility]  Pediatrician List:   [facility]      Pediatrician Fax Number:    Risk Factors/Current Problems:  None   Cognitive State:  Able to Concentrate , Alert , Goal Oriented    Mood/Affect:  Calm , Happy    CSW Assessment: CSW received a consult due to staff having concerns about MOB not following recommendations for safe sleep despite  understanding this education.  CSW spoke with RN following MOB and Baby today, Rachel Waters. Per Rachel Waters, she has not noticed any concerns with MOB/FOB caring for Baby today.   CSW met with MOB at bedside. Explained HIPPA and MOB elected for FOB to remain at bedside. MOB was alert and appropriate during encounter.  MOB reported she is feeling good post delivery. MOB denied mental health history/current needs and reported she is coping well emotionally. She denied SI, HI, or DV. She reported she has a good support system.  MOB plans to use [facility] for [facility]. MOB reported she has a new car seat and all other items needed for Baby. MOB reported she drives and has access to a reliable means transportation.  CSW provided education and information sheets on PPD and SIDS. Both FOB and MOB verbalized understanding of education given. CSW encouraged MOB and FOB to reach out to her Provider with any questions or needs for support, even after discharge. RN Rachel Waters reported she will provide additional education on safe sleep and overall education on infant care prior to discharge planned for today.  CSW encouraged MOB and FOB to reach out with  any questions or needs.   CSW called to make CPS report due to RN notes stating on 5/29 Baby had a blanket over her face and safe sleep education was provided, then on 5/30 Baby was in bed with MOB and FOB after previous education was given, and safe sleep education was provided again. Spoke with on-call Communications who said they will have a Education officer, museum call for report.  Please re-consult TOC if additional safety concerns continue.     CSW Plan/Description:  Sudden Infant Death Syndrome (SIDS) Education, Perinatal Mood and Anxiety Disorder (PMADs) Education, No Further Intervention Required/No Barriers to Discharge    [place], LCSW 05/22/2020, 11:31 AM

## 2020-05-24 LAB — SURGICAL PATHOLOGY

## 2020-06-02 IMAGING — US OBSTETRIC <14 WK ULTRASOUND
1 series · 14 of 28 positions shown · non-contrast
Comparison: None.

CLINICAL DATA: Abdominal pain status post abdominal injury
yesterday. Beta HCG of 41.

EXAM:
OBSTETRIC <14 WK ULTRASOUND; TRANSVAGINAL OB ULTRASOUND
TECHNIQUE: Transvaginal ultrasound was performed for complete evaluation of the
gestation as well as the maternal uterus, adnexal regions, and
pelvic cul-de-sac.

[Series 1: obstetric <14 wk ultrasound · 14 of 125 slices shown]
[im 5/125]
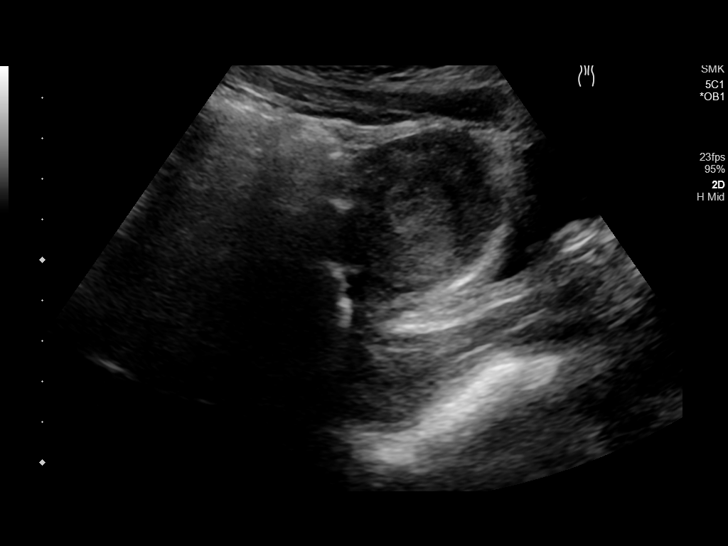
[im 14/125]
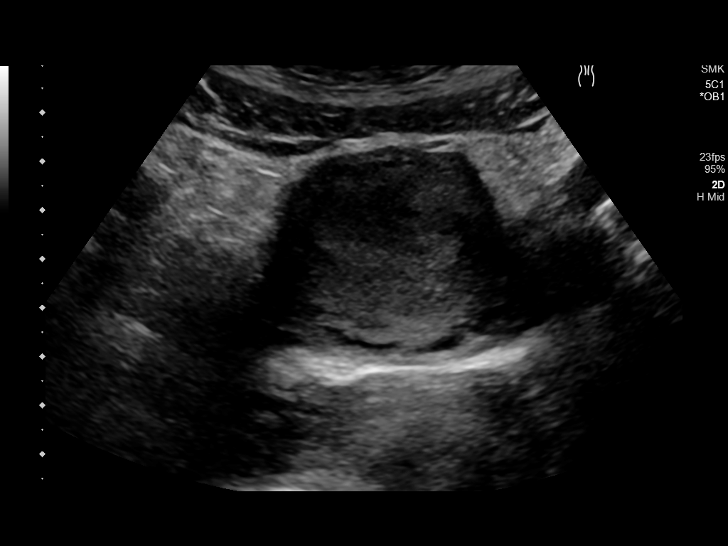
[im 23/125]
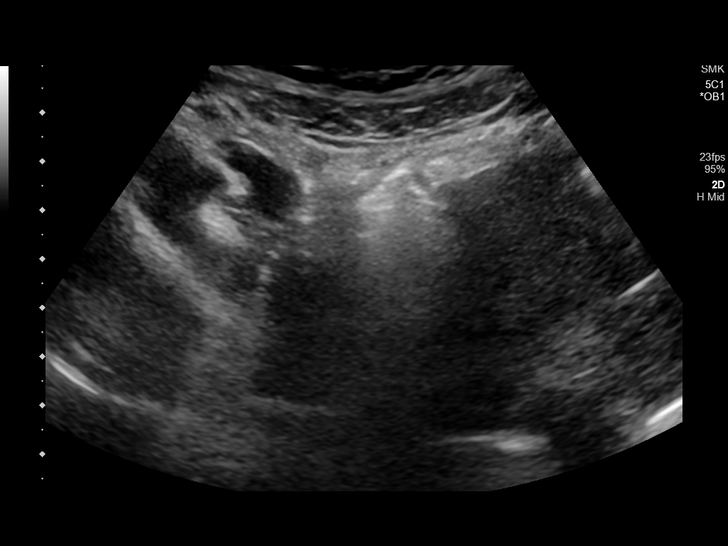
[im 33/125]
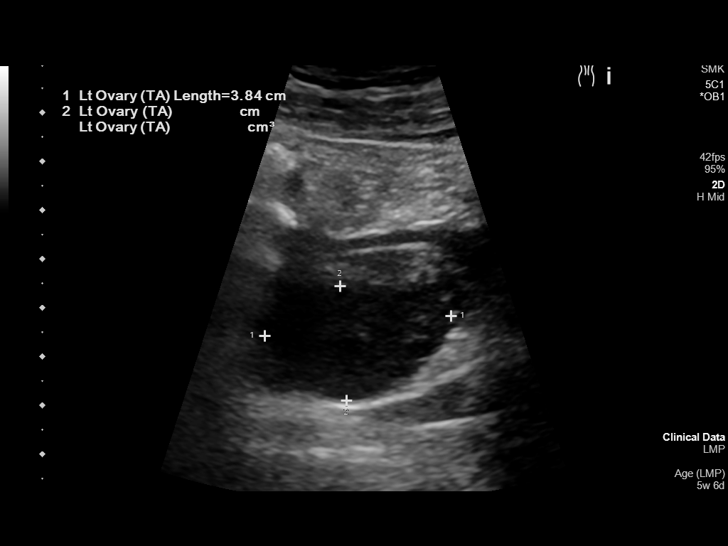
[im 42/125]
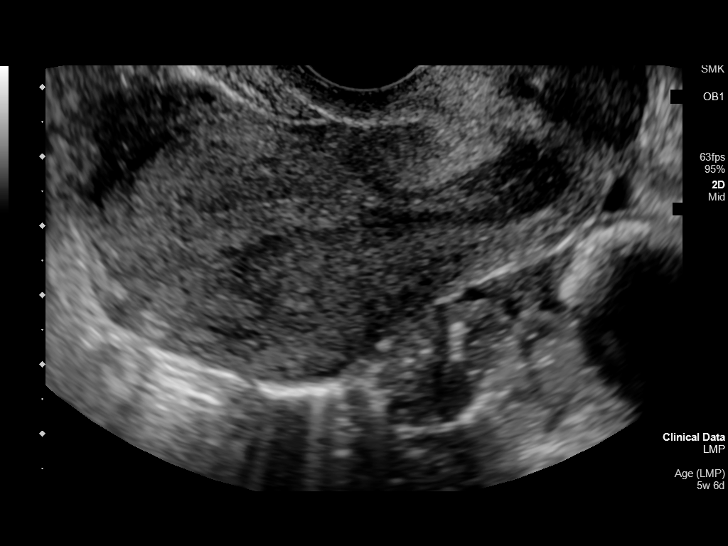
[im 51/125]
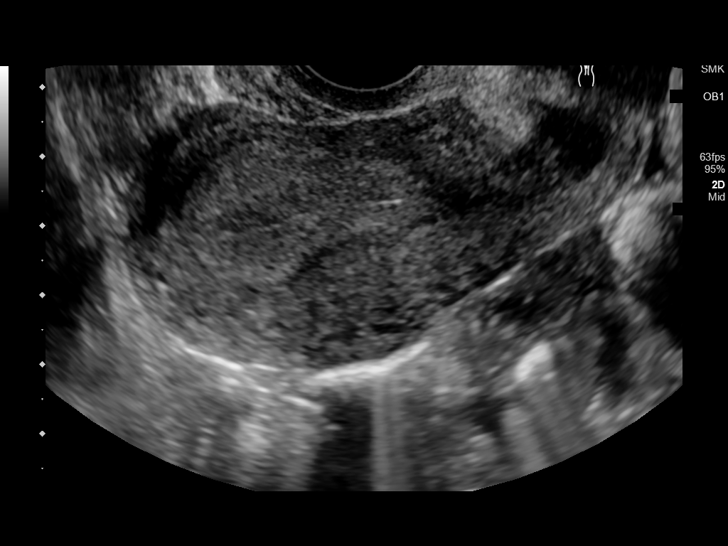
[im 60/125]
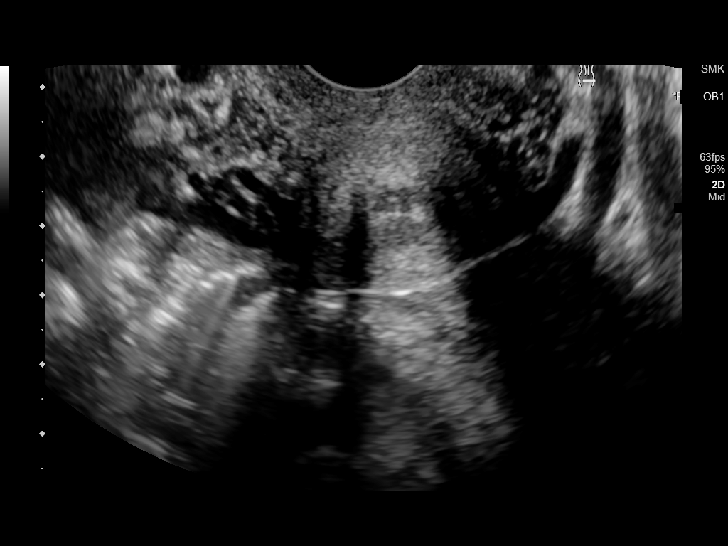
[im 69/125]
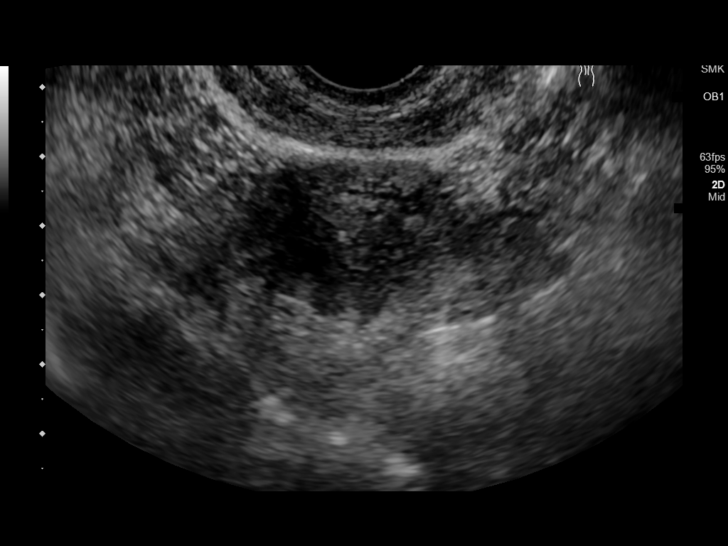
[im 79/125]
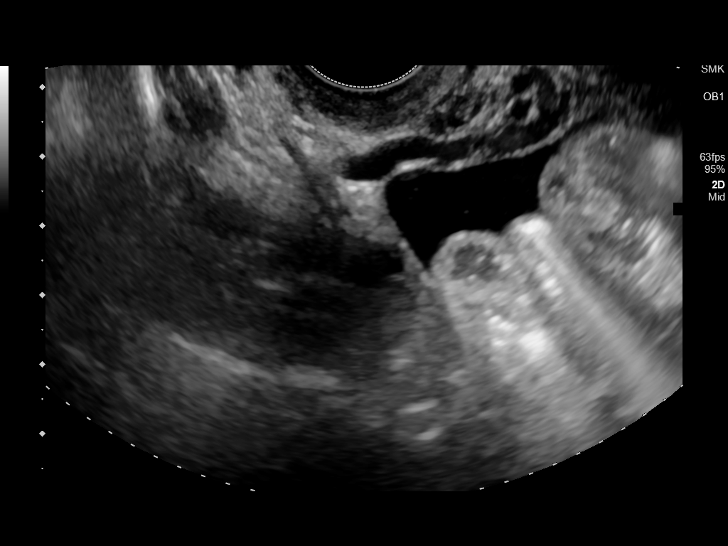
[im 88/125]
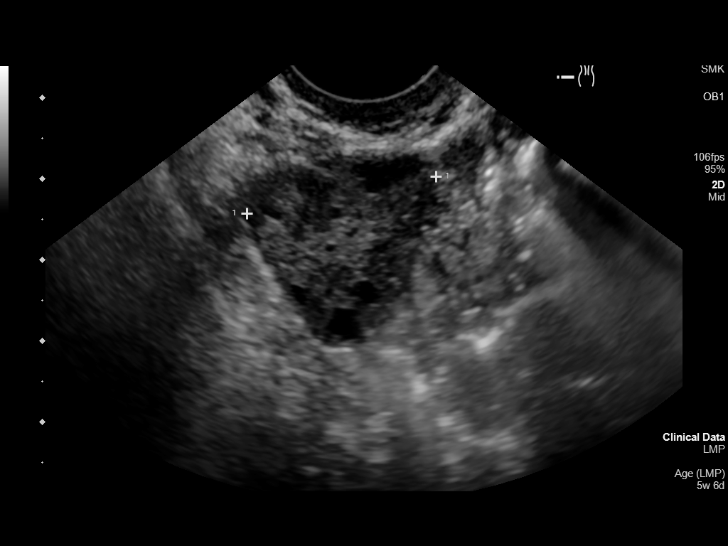
[im 97/125]
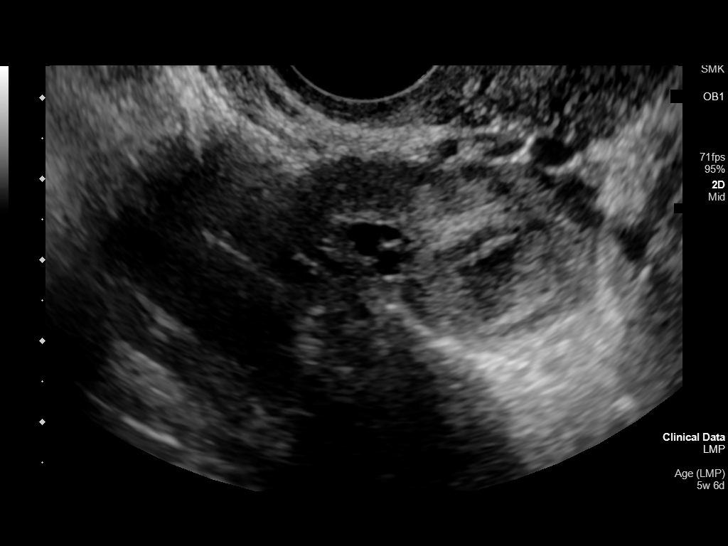
[im 106/125]
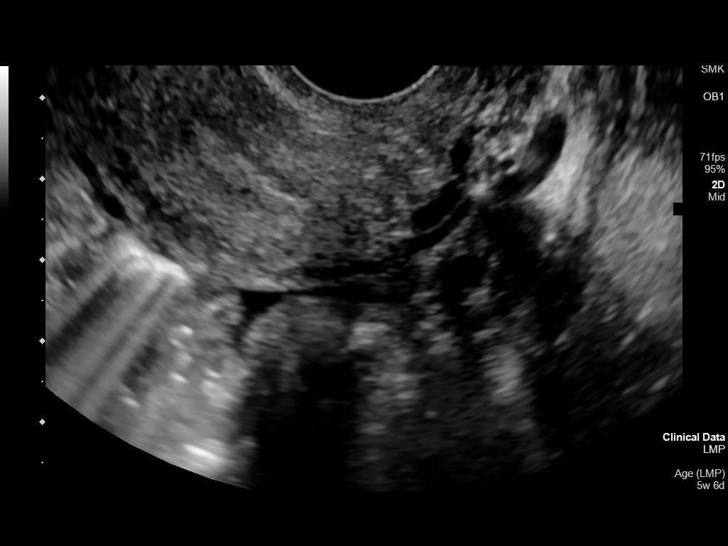
[im 115/125]
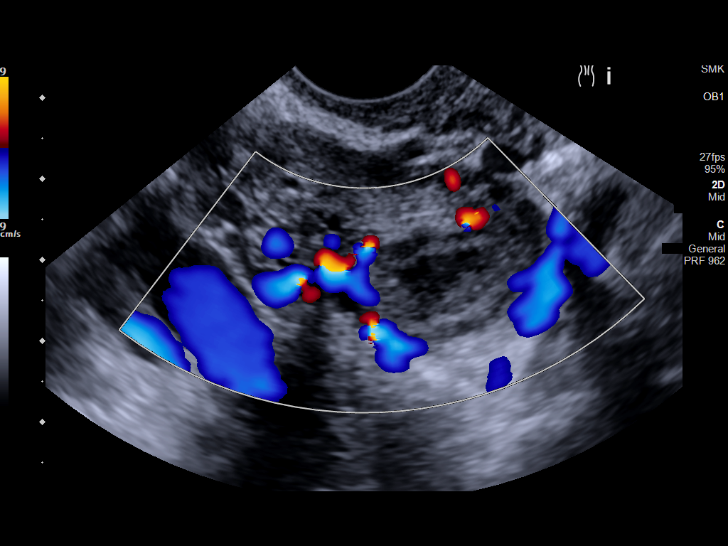
[im 125/125]
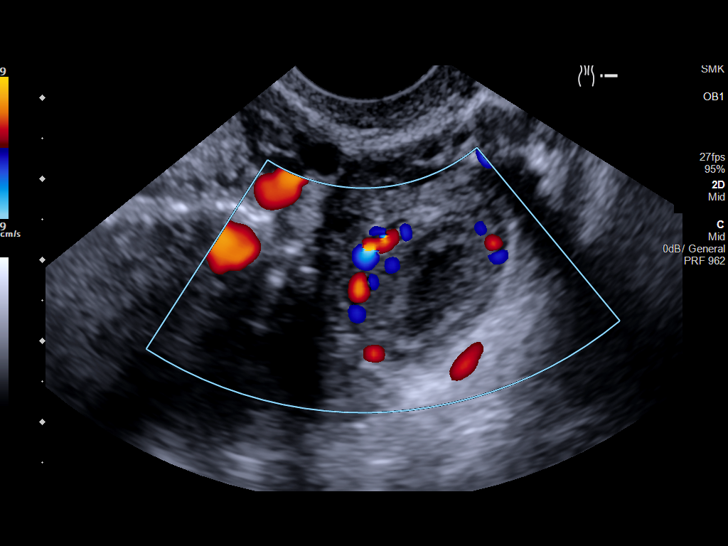

[14 of 28 positions shown; findings below may reference images not displayed]

FINDINGS: Intrauterine gestational sac: None

Yolk sac:  Not seen

Embryo:  Not Seen

Cardiac Activity: Not Seen

Maternal uterus/adnexae: Uterus appears normal. Endometrial complex
is normal in thickness. No mass or free fluid appreciated within the
endometrial canal. RIGHT and LEFT ovaries appear normal without mass
or cyst. No mass appreciated within either adnexal region. Small
amount of free fluid in the cul-de-sac and RIGHT adnexal region is
likely physiologic in nature.
IMPRESSION: Normal pelvic ultrasound. No intrauterine pregnancy identified. No
adnexal mass or significant free fluid.

## 2020-06-27 ENCOUNTER — Encounter: Payer: Self-pay | Admitting: Obstetrics and Gynecology

## 2021-08-29 DIAGNOSIS — Z3490 Encounter for supervision of normal pregnancy, unspecified, unspecified trimester: Secondary | ICD-10-CM | POA: Insufficient documentation

## 2021-10-04 LAB — OB RESULTS CONSOLE RUBELLA ANTIBODY, IGM: Rubella: IMMUNE

## 2021-10-04 LAB — OB RESULTS CONSOLE VARICELLA ZOSTER ANTIBODY, IGG: Varicella: IMMUNE

## 2021-12-23 NOTE — L&D Delivery Note (Signed)
Delivery Note ? ?Date of delivery: 04/22/2022 ?Estimated Date of Delivery: 04/24/22 ?No LMP recorded. Patient is pregnant. ?EGA: [redacted]w[redacted]d ? ?Delivery Note ?At 10:50 PM a viable female was delivered via Vaginal, Spontaneous (Presentation: Left Occiput Anterior).  APGAR: 8, 9; weight pending.  Placenta status: Spontaneous, Intact.  Cord: 3 vessels with the following complications: None.   ? ?First Stage: ?Labor onset: 1600 ?Augmentation : none ?Analgesia /Anesthesia intrapartum: none ?SROM with AROM at 2224 ? ?Rachel Waters presented to L&D with active labor.  ? ?Second Stage: ?Complete dilation at 2045 ?Onset of pushing at 2045 ?FHR second stage IA reassuring ?Delivery at 2250 on 04/22/2022 ? ?She progressed to complete and had a spontaneous vaginal birth of a live female over an intact perineum. The fetal head was delivered in OA position with restitution to LOA. 1x nuchal cord resolved with summersault maneuver . Anterior then posterior shoulders delivered spontaneously. Baby placed on mom's abdomen and attended to by transition RN. Cord clamped and cut when pulseless by FOB. Cord blood obtained for newborn labs. ? ?Third Stage: ?Placenta delivered intact with 3VC at 2302 ?Placenta disposition: routine disposal ?Uterine tone firm / bleeding min -- TXA given d/t history of PPH ?IV pitocin given for hemorrhage prophylaxis ? ?Anesthesia: None ?Episiotomy: None ?Lacerations: None ?Suture Repair: n/a ?Est. Blood Loss (mL):  360 ? ?Complications: none ? ?Mom to postpartum.  Baby to Couplet care / Skin to Skin. ? ?Newborn: ?Birth Weight: pending  ?Apgar Scores: 8,9 ?Feeding planned: Breastfeeding ? ? ?Cyril Mourning, CNM ?04/22/2022 11:07 PM ?  ?

## 2022-03-20 DIAGNOSIS — O0933 Supervision of pregnancy with insufficient antenatal care, third trimester: Secondary | ICD-10-CM

## 2022-03-20 DIAGNOSIS — Z8759 Personal history of other complications of pregnancy, childbirth and the puerperium: Secondary | ICD-10-CM

## 2022-04-05 LAB — OB RESULTS CONSOLE GC/CHLAMYDIA
Chlamydia: NEGATIVE
Gonorrhea: NEGATIVE

## 2022-04-05 LAB — OB RESULTS CONSOLE HIV ANTIBODY (ROUTINE TESTING): HIV: NONREACTIVE

## 2022-04-05 LAB — OB RESULTS CONSOLE HEPATITIS B SURFACE ANTIGEN: Hepatitis B Surface Ag: NEGATIVE

## 2022-04-05 LAB — OB RESULTS CONSOLE RPR: RPR: NONREACTIVE

## 2022-04-05 LAB — OB RESULTS CONSOLE GBS: GBS: NEGATIVE

## 2022-04-22 ENCOUNTER — Encounter: Payer: Self-pay | Admitting: Obstetrics and Gynecology

## 2022-04-22 ENCOUNTER — Other Ambulatory Visit: Payer: Self-pay

## 2022-04-22 ENCOUNTER — Inpatient Hospital Stay
Admission: EM | Admit: 2022-04-22 | Discharge: 2022-04-24 | DRG: 807 | Disposition: A | Discharge status: Home / Self Care | Payer: Commercial Managed Care - PPO | Attending: Obstetrics and Gynecology | Admitting: Obstetrics and Gynecology

## 2022-04-22 DIAGNOSIS — Z3A39 39 weeks gestation of pregnancy: Secondary | ICD-10-CM

## 2022-04-22 DIAGNOSIS — O0933 Supervision of pregnancy with insufficient antenatal care, third trimester: Secondary | ICD-10-CM

## 2022-04-22 DIAGNOSIS — O26893 Other specified pregnancy related conditions, third trimester: Secondary | ICD-10-CM | POA: Diagnosis present

## 2022-04-22 DIAGNOSIS — Z8759 Personal history of other complications of pregnancy, childbirth and the puerperium: Secondary | ICD-10-CM

## 2022-04-22 DIAGNOSIS — O26843 Uterine size-date discrepancy, third trimester: Secondary | ICD-10-CM | POA: Diagnosis present

## 2022-04-22 LAB — CBC
HCT: 35.4 % — ABNORMAL LOW (ref 36.0–46.0)
Hemoglobin: 11.6 g/dL — ABNORMAL LOW (ref 12.0–15.0)
MCH: 27 pg (ref 26.0–34.0)
MCHC: 32.8 g/dL (ref 30.0–36.0)
MCV: 82.5 fL (ref 80.0–100.0)
Platelets: 223 10*3/uL (ref 150–400)
RBC: 4.29 MIL/uL (ref 3.87–5.11)
RDW: 13.9 % (ref 11.5–15.5)
WBC: 9.1 10*3/uL (ref 4.0–10.5)
nRBC: 0 % (ref 0.0–0.2)

## 2022-04-22 LAB — TYPE AND SCREEN
ABO/RH(D): O POS
Antibody Screen: NEGATIVE

## 2022-04-22 LAB — RUPTURE OF MEMBRANE (ROM)PLUS: Rom Plus: POSITIVE

## 2022-04-22 MED ORDER — ACETAMINOPHEN 325 MG PO TABS: 650.0000 mg | ORAL_TABLET | ORAL | Status: DC | PRN

## 2022-04-22 MED ORDER — ONDANSETRON HCL 4 MG/2ML IJ SOLN: 4.0000 mg | Freq: Four times a day (QID) | INTRAMUSCULAR | Status: DC | PRN

## 2022-04-22 MED ORDER — SOD CITRATE-CITRIC ACID 500-334 MG/5ML PO SOLN: 30.0000 mL | ORAL | Status: DC | PRN

## 2022-04-22 MED ORDER — OXYCODONE-ACETAMINOPHEN 5-325 MG PO TABS: 1.0000 | ORAL_TABLET | ORAL | Status: DC | PRN

## 2022-04-22 MED ORDER — OXYTOCIN 10 UNIT/ML IJ SOLN
INTRAMUSCULAR | Status: AC
  Filled 2022-04-22: qty 2

## 2022-04-22 MED ORDER — OXYTOCIN-SODIUM CHLORIDE 30-0.9 UT/500ML-% IV SOLN
2.5000 [IU]/h | INTRAVENOUS | Status: DC
  Administered 2022-04-22: 2.5 [IU]/h via INTRAVENOUS
  Filled 2022-04-22: qty 500

## 2022-04-22 MED ORDER — LACTATED RINGERS IV SOLN: 500.0000 mL | INTRAVENOUS | Status: DC | PRN

## 2022-04-22 MED ORDER — OXYCODONE-ACETAMINOPHEN 5-325 MG PO TABS: 2.0000 | ORAL_TABLET | ORAL | Status: DC | PRN

## 2022-04-22 MED ORDER — LACTATED RINGERS IV SOLN: INTRAVENOUS | Status: DC

## 2022-04-22 MED ORDER — LIDOCAINE HCL (PF) 1 % IJ SOLN: 30.0000 mL | INTRAMUSCULAR | Status: DC | PRN

## 2022-04-22 MED ORDER — TRANEXAMIC ACID-NACL 1000-0.7 MG/100ML-% IV SOLN
INTRAVENOUS | Status: AC
  Administered 2022-04-22: 1000 mg
  Filled 2022-04-22: qty 100

## 2022-04-22 MED ORDER — OXYTOCIN BOLUS FROM INFUSION
333.0000 mL | Freq: Once | INTRAVENOUS | Status: AC
  Administered 2022-04-22: 333 mL via INTRAVENOUS

## 2022-04-22 MED ORDER — AMMONIA AROMATIC IN INHA
RESPIRATORY_TRACT | Status: AC
  Filled 2022-04-22: qty 10

## 2022-04-22 MED ORDER — LIDOCAINE HCL (PF) 1 % IJ SOLN
INTRAMUSCULAR | Status: AC
  Filled 2022-04-22: qty 30

## 2022-04-22 MED ORDER — MISOPROSTOL 200 MCG PO TABS
ORAL_TABLET | ORAL | Status: AC
  Filled 2022-04-22: qty 4

## 2022-04-22 MED ORDER — TRANEXAMIC ACID-NACL 1000-0.7 MG/100ML-% IV SOLN: 1000.0000 mg | INTRAVENOUS | Status: DC

## 2022-04-22 NOTE — OB Triage Note (Signed)
Pt Rachel Waters 26 y.o. presents to labor and delivery triage reporting losing her mucus plug. Pt is a G3P0020 at [redacted]w[redacted]d. Pt denies signs and symptons consistent with rupture of membranes or active vaginal bleeding. Pt denies contractions and states positive fetal movement. External FM and TOCO applied to non-tender abdomen and assessing. Initial FHR 130. Vital signs obtained and within normal limits. Provider notified of pt. ? ?

## 2022-04-22 NOTE — Discharge Summary (Signed)
Obstetrical Discharge Summary ? ?Patient Name: Rachel Waters ?DOB: 1996/01/30 ?MRN: [id number] ? ?Date of Admission: 04/22/2022 ?Date of Delivery: 04/22/22 ?Delivered by: Annamary Rummage CNM ?Date of Discharge: 04/24/22 ? ?Primary OB: [facility]  ?LMP:No LMP recorded. ?EDC Estimated Date of Delivery: 04/24/22 ?Gestational Age at Delivery: [redacted]w[redacted]d  ? ?Antepartum complications:  ?1. Headaches ?2. Depression noted at NOB ?3. H/o retained placenta and PPH ?4. 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 Uterine size/date discrepancy - 04/09/22: EFW=3061g=55% ?6. Limited prenatal care in 2nd and 3rd trimester ? ?Admitting Diagnosis: Active labor ?Secondary Diagnosis: ?Patient Active Problem List  ? Diagnosis Date Noted  ? Uterine size-date discrepancy, third trimester 04/23/2022  ? NSVD (normal spontaneous vaginal delivery) 04/22/2022  ? History of postpartum hemorrhage 03/20/2022  ? Limited prenatal care, third trimester 03/20/2022  ? Supervision of normal pregnancy 08/29/2021  ? History of miscarriage 09/20/2019  ? ? ?Augmentation: AROM ?Complications: None ? ?Intrapartum complications/course:  ?Delivery Type: spontaneous vaginal delivery ?Anesthesia: none ?Placenta: spontaneous ?Laceration: none ?Episiotomy: none ?Newborn Data: ?Live born female  ?Birth Weight:  2760g ?APGAR: 8, 9 ? ?Newborn Delivery   ?Birth date/time: 04/22/2022 22:50:00 ?Delivery type: Vaginal, Spontaneous ?  ?  ? ?26 y.o. G3P1011 at [redacted]w[redacted]d presenting with active labor, SROM with clear fluid then AROM of a forebag.  She progressed to complete and pushed over an intact perineum and delivered the fetal head, followed promptly by the shoulders. She was in control the whole time, and the baby placed on the maternal abdomen. Delayed cord clamping and the FOB cut the baby's cord, while the baby was skin to skin. The placenta delivered spontaneously and intact. No laceration. Mom and baby tolerated the procedure well.  ? ?Postpartum Procedures:  ? ?Post  partum course:  ?Patient had an uncomplicated postpartum course.  By time of discharge on PPD#2, her pain was controlled on oral pain medications; she had appropriate lochia and was ambulating, voiding without difficulty and tolerating regular diet.  She was deemed stable for discharge to home.   ? ?Discharge Physical Exam:  ?BP 90/63 (BP Location: Left Arm)   Pulse 73   Temp 98.7 ?F (37.1 ?C) (Oral)   Resp 18   SpO2 99%   Breastfeeding Unknown  ? ?General: alert and no distress ?Pulm: normal respiratory effort ?Lochia: appropriate ?Abdomen: soft, NT ?Uterine Fundus: firm, below umbilicus ?Extremities: No evidence of DVT seen on physical exam. No lower extremity edema. ?Edinburgh:  ? ?  04/23/2022  ? 11:54 AM 05/21/2020  ?  2:21 AM  ?Inocente Salles Postnatal Depression Scale Screening Tool  ?I have been able to laugh and see the funny side of things. 0 0  ?I have looked forward with enjoyment to things. 0 1  ?I have blamed myself unnecessarily when things went wrong. 2 0  ?I have been anxious or worried for no good reason. 0 1  ?I have felt scared or panicky for no good reason. 0 0  ?Things have been getting on top of me. 0 1  ?I have been so unhappy that I have had difficulty sleeping. 0 1  ?I have felt sad or miserable. 1 0  ?I have been so unhappy that I have been crying. 0 0  ?The thought of harming myself has occurred to me. 0 0  ?Edinburgh Postnatal Depression Scale Total 3 4  ?  ? ?Labs: ? ?  Latest Ref Rng & Units 04/22/2022  ?  6:48 PM 05/21/2020  ?  7:13 AM 05/19/2020  ?  7:25 PM  ?CBC  ?WBC 4.0 - 10.5 K/uL 9.1   11.6   8.0    ?Hemoglobin 12.0 - 15.0 g/dL 16.111.6   8.8   09.612.2    ?Hematocrit 36.0 - 46.0 % 35.4   27.3   36.7    ?Platelets 150 - 400 K/uL 223   185   199    ? ?O POS ?Hemoglobin  ?Date Value Ref Range Status  ?04/22/2022 11.6 (L) 12.0 - 15.0 g/dL Final  ? ?HCT  ?Date Value Ref Range Status  ?04/22/2022 35.4 (L) 36.0 - 46.0 % Final  ? ?Patient declined post delivery CBC ?Disposition: stable, discharge to  home ?Baby Feeding: breastmilk ?Baby Disposition: home with mom ? ?Contraception: declined ? ?Prenatal Labs:  ?Blood type/Rh O pos  ?Antibody screen neg  ?Rubella Immune  ?Varicella Immune  ?RPR Reactive; T Pallidum: NR  ?HBsAg Neg  ?HIV NR  ?GC neg  ?Chlamydia neg  ?Genetic screening negative  ?1 hour GTT Not done  ?3 hour GTT    ?GBS Neg  ? ?Rh Immune globulin given: n/a ?Rubella vaccine given: Immune ?Varicella vaccine given: Immune ?Tdap vaccine given in AP or PP setting: Declined in AP setting ?Flu vaccine given in AP or PP setting: Declined in AP setting ? ?Plan: ?Rachel Waters was discharged to home in good condition. ?Follow-up appointment in 2 weeks for a mood check and  with delivering provider in 6 weeks. ? ?Discharge Instructions: Per After Visit Summary. ?Activity: Advance as tolerated. Pelvic rest for 6 weeks.   ?Diet: Regular ?Discharge Medications: ?Allergies as of 04/24/2022   ?No Known Allergies ?  ? ?  ?Medication List  ?  ? ?STOP taking these medications   ? ?acetaminophen 160 MG/5ML solution ?Commonly known as: TYLENOL ?Replaced by: acetaminophen 325 MG tablet ?  ?docusate 50 MG/5ML liquid ?Commonly known as: COLACE ?  ?ferrous sulfate 220 (44 Fe) MG/5ML solution ?Replaced by: ferrous sulfate 325 (65 FE) MG tablet ?  ?ibuprofen 100 MG/5ML suspension ?Commonly known as: ADVIL ?Replaced by: ibuprofen 600 MG tablet ?  ? ?  ? ?TAKE these medications   ? ?acetaminophen 325 MG tablet ?Commonly known as: Tylenol ?Take 2 tablets (650 mg total) by mouth every 4 (four) hours as needed (for pain scale < 4). ?Replaces: acetaminophen 160 MG/5ML solution ?  ?benzocaine-Menthol 20-0.5 % Aero ?Commonly known as: DERMOPLAST ?Apply 1 application. topically as needed for irritation (perineal discomfort). ?  ?coconut oil Oil ?Apply 1 application. topically as needed. ?  ?dibucaine 1 % Oint ?Commonly known as: NUPERCAINAL ?Place 1 application. rectally as needed for hemorrhoids. ?  ?ferrous sulfate 325 (65  FE) MG tablet ?Take 1 tablet (325 mg total) by mouth 2 (two) times daily with a meal. ?Replaces: ferrous sulfate 220 (44 Fe) MG/5ML solution ?  ?ibuprofen 600 MG tablet ?Commonly known as: ADVIL ?Take 1 tablet (600 mg total) by mouth every 6 (six) hours. ?Replaces: ibuprofen 100 MG/5ML suspension ?  ?multivitamin-prenatal 27-0.8 MG Tabs tablet ?Take 1 tablet by mouth daily at 12 noon. ?  ?senna-docusate 8.6-50 MG tablet ?Commonly known as: Senokot-S ?Take 2 tablets by mouth daily. ?Start taking on: Apr 25, 2022 ?  ?simethicone 80 MG chewable tablet ?Commonly known as: MYLICON ?Chew 1 tablet (80 mg total) by mouth as needed for flatulence. ?  ?witch hazel-glycerin pad ?Commonly known as: TUCKS ?Apply 1 application. topically as needed for hemorrhoids. ?  ? ?  ? ?Outpatient  follow up:  ? Follow-up Information   ? ? Haroldine Laws, CNM Follow up in 6 week(s).   ?Specialty: Certified Nurse Midwife ?Why: PP visit ?Contact information: ?[address] ?[place] [address] ?[phone] ? ? ?  ?  ? ? [facility] Follow up in 2 week(s).   ?Why: mood check ?Contact information: ?[address] ?[place] [address] ?[phone] ? ?  ?  ? ?  ?  ? ?  ? ? ?Signed: ? ?Chari Manning CNM ? ?04/24/2022 10:30 AM ? ?

## 2022-04-22 NOTE — Progress Notes (Signed)
Labor Progress Note ? ?Rachel Waters is a 26 y.o. G3P1011 at [redacted]w[redacted]d by ultrasound admitted for active labor ? ?Subjective: Feeling UCs more intensely  ? ?Objective: ?BP 124/85 (BP Location: Right Arm)   Pulse 70   Temp 98.2 ?F (36.8 ?C) (Oral)   Resp 18  ? ?Fetal Assessment: ?FHT:  FHR: 150 bpm, variability: moderate,  accelerations:  Abscent,  decelerations:  Absent ?Category/reactivity:  Category I ?UC:   regular, every 1-2 minutes ?SVE:    ?Dilation: 9cm  ?Effacement: 100%  ?Station:  0  ?Consistency: soft  ?Position: anterior  ?Membrane status: SROM ?Amniotic color: Clear ? ?Labs: ?Lab Results  ?Component Value Date  ? WBC 9.1 04/22/2022  ? HGB 11.6 (L) 04/22/2022  ? HCT 35.4 (L) 04/22/2022  ? MCV 82.5 04/22/2022  ? PLT 223 04/22/2022  ? ? ?Assessment / Plan: ?Spontaneous labor, progressing normally ? ?Labor: Progressing normally, AROM'd a forebag ?Preeclampsia:   124/85 ?Fetal Wellbeing:  Category I ?Pain Control:  Labor support without medications ?I/D:   Afebrile, GBS neg, SROM unknown time ?Anticipated MOD:  NSVD ? ?Cyril Mourning, CNM ?04/22/2022, 10:35 PM ? ? ? ? ? ? ?  ?

## 2022-04-22 NOTE — H&P (Signed)
OB History & Physical  ? ?History of Present Illness:  ?Chief Complaint:  ? ?HPI:  ?Rachel Waters is a 26 y.o. G39P1011 female at [redacted]w[redacted]d dated by 5 week u/s.  She presents to L&D for uterine contractions. ? ?She reports:  ?-active fetal movement ?-no leakage of fluid ?-no vaginal bleeding ?-onset of contractions currently every 10 minutes ? ?Pregnancy Issues: ?1. Headaches ?2. Depression noted at NOB ?3. H/o retained placenta and PPH ?4. 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 Uterine size/date discrepancy - 04/09/22: EFW=3061g=55% ?6. Limited prenatal care in 2nd and 3rd trimester ? ? ?Maternal Medical History:  ? ?Past Medical History:  ?Diagnosis Date  ? Medical history non-contributory   ? ? ?Past Surgical History:  ?Procedure Laterality Date  ? DILATION AND CURETTAGE OF UTERUS N/A 05/20/2020  ? Procedure: DILATATION AND CURETTAGE;  Surgeon: Schermerhorn, Ihor Austin, MD;  Location: [facility] ORS;  Service: Gynecology;  Laterality: N/A;  ? NO PAST SURGERIES    ? ? ?No Known Allergies ? ?Prior to Admission medications   ?Medication Sig Start Date End Date Taking? Authorizing Provider  ?Prenatal Vit-Fe Fumarate-FA (MULTIVITAMIN-PRENATAL) 27-0.8 MG TABS tablet Take 1 tablet by mouth daily at 12 noon.   Yes [provider]  ?acetaminophen (TYLENOL) 160 MG/5ML solution Take 10.2 mLs (325 mg total) by mouth every 4 (four) hours as needed for mild pain, headache or fever. ?Patient not taking: Reported on 04/22/2022 05/22/20   Haroldine Laws, CNM  ?docusate (COLACE) 50 MG/5ML liquid Take 10 mLs (100 mg total) by mouth daily. ?Patient not taking: Reported on 04/22/2022 05/22/20   Haroldine Laws, CNM  ?ferrous sulfate 220 (44 Fe) MG/5ML solution Take 6.8 mLs (300 mg total) by mouth daily with breakfast. ?Patient not taking: Reported on 04/22/2022 05/22/20   Haroldine Laws, CNM  ?ibuprofen (ADVIL) 100 MG/5ML suspension Take 30 mLs (600 mg total) by mouth every 6 (six) hours. ?Patient not taking: Reported on  04/22/2022 05/22/20   Haroldine Laws, CNM  ? ? ? ?Prenatal care site: [facility]  ? ?Social History: She  reports that she has never smoked. She has never used smokeless tobacco. She reports that she does not currently use alcohol. She reports that she does not use drugs. ? ?Family History: family history is not on file.  ? ?Review of Systems: A full review of systems was performed and negative except as noted in the HPI.   ? ?Physical Exam:  ?Vital Signs: BP 120/79 (BP Location: Left Arm)   Pulse 82   Temp 98.6 ?F (37 ?C) (Oral)   Resp 18  ? ?General:   alert and cooperative  ?Skin:  normal  ?Neurologic:    Alert & oriented x 3  ?Lungs:    Nl effort  ?Heart:   regular rate and rhythm  ?Abdomen:  soft, non-tender; bowel sounds normal; no masses,  no organomegaly  ?Extremities: : non-tender, symmetric, no edema bilaterally.     ? ? ?Pertinent Results:  ?Prenatal Labs: ?Blood type/Rh O pos  ?Antibody screen neg  ?Rubella Immune  ?Varicella Immune  ?RPR Reactive; T Pallidum: NR  ?HBsAg Neg  ?HIV NR  ?GC neg  ?Chlamydia neg  ?Genetic screening negative  ?1 hour GTT Not done  ?3 hour GTT   ?GBS Neg  ? ?FHT: FHR: 125 bpm, variability: moderate,  accelerations:  Present,  decelerations:  Absent ?Category/reactivity:  Category I ?TOCO: regular, every 6-8 minutes ?SVE: Dilation: 6 / Effacement (%): 90 / Station: -  1  ?  ? ?Assessment:  ?Rachel Waters is a 26 y.o. G73P1011 female at [redacted]w[redacted]d with uterine contractions.  ? ?Plan:  ?1. Admit to Labor & Delivery; consents reviewed and obtained ? ?2. Fetal Well being  ?- Fetal Tracing: Cat I ?- GBS neg ?- Presentation: Vtx confirmed by SVE  ? ?3. Routine OB: ?- Prenatal labs reviewed, as above ?- Rh pos ?- CBC & T&S on admit ?- Clear fluids, IVF ? ?4. Monitoring of Labor ?-  Contractions by external toco in place ?-  Pelvis proven to 3204g  ?-  Plan for continuous fetal monitoring  ?-  Maternal pain control as desired: IVPM, nitrous, regional anesthesia ?-  Anticipate vaginal delivery ? ?5. Post Partum Planning: ?- Infant feeding: Breastfeeding ?- Contraception: TBD ?- Flu: Declined ?-Tdap: Declined ? ?Haroldine Laws, CNM ?04/22/2022 6:26 PM ? ?  ?

## 2022-04-22 NOTE — Progress Notes (Signed)
Labor Progress Note ? ?Rachel Waters is a 26 y.o. G3P1011 at [redacted]w[redacted]d by ultrasound admitted for active labor ? ?Subjective: Feeling UCs more intensely  ? ?Objective: ?BP 124/85 (BP Location: Right Arm)   Pulse 70   Temp 98.2 ?F (36.8 ?C) (Oral)   Resp 18  ? ?Fetal Assessment: ?FHT:  FHR: 145 bpm, variability: moderate,  accelerations:  Abscent,  decelerations:  Absent ?Category/reactivity:  Category I ?UC:   regular, every 1-2 minutes ?SVE:    ?Dilation: 7cm  ?Effacement: 90%  ?Station:  0  ?Consistency: soft  ?Position: anterior  ?Membrane status: SROM ?Amniotic color: Clear ? ?Labs: ?Lab Results  ?Component Value Date  ? WBC 9.1 04/22/2022  ? HGB 11.6 (L) 04/22/2022  ? HCT 35.4 (L) 04/22/2022  ? MCV 82.5 04/22/2022  ? PLT 223 04/22/2022  ? ? ?Assessment / Plan: ?Spontaneous labor, progressing normally ? ?Labor: Progressing normally ?Preeclampsia:   124/85 ?Fetal Wellbeing:  Category I ?Pain Control:  Labor support without medications ?I/D:   Afebrile, GBS neg, SROM unknown time ?Anticipated MOD:  NSVD ? ?Cyril Mourning, CNM ?04/22/2022, 10:31 PM ? ? ? ? ? ? ?  ?

## 2022-04-23 DIAGNOSIS — O26843 Uterine size-date discrepancy, third trimester: Secondary | ICD-10-CM | POA: Diagnosis present

## 2022-04-23 LAB — RPR
RPR Ser Ql: REACTIVE — AB
RPR Titer: 1:1 {titer}

## 2022-04-23 MED ORDER — WITCH HAZEL-GLYCERIN EX PADS
1.0000 | MEDICATED_PAD | CUTANEOUS | Status: DC | PRN
Start: 2022-04-23 — End: 2022-04-24
  Administered 2022-04-23: 1 via TOPICAL
  Filled 2022-04-23: qty 100

## 2022-04-23 MED ORDER — PRENATAL MULTIVITAMIN CH
1.0000 | ORAL_TABLET | Freq: Every day | ORAL | Status: DC
  Administered 2022-04-23: 1 via ORAL
  Filled 2022-04-23: qty 1

## 2022-04-23 MED ORDER — IBUPROFEN 600 MG PO TABS
600.0000 mg | ORAL_TABLET | Freq: Four times a day (QID) | ORAL | Status: DC
  Administered 2022-04-23 – 2022-04-24 (×4): 600 mg via ORAL
  Filled 2022-04-23 (×4): qty 1

## 2022-04-23 MED ORDER — ONDANSETRON HCL 4 MG/2ML IJ SOLN: 4.0000 mg | INTRAMUSCULAR | Status: DC | PRN

## 2022-04-23 MED ORDER — TETANUS-DIPHTH-ACELL PERTUSSIS 5-2.5-18.5 LF-MCG/0.5 IM SUSY
0.5000 mL | PREFILLED_SYRINGE | Freq: Once | INTRAMUSCULAR | Status: DC
  Filled 2022-04-23: qty 0.5

## 2022-04-23 MED ORDER — SENNOSIDES-DOCUSATE SODIUM 8.6-50 MG PO TABS
2.0000 | ORAL_TABLET | Freq: Every day | ORAL | Status: DC
Start: 2022-04-23 — End: 2022-04-24
  Administered 2022-04-23 – 2022-04-24 (×2): 2 via ORAL
  Filled 2022-04-23 (×2): qty 2

## 2022-04-23 MED ORDER — OXYCODONE HCL 5 MG PO TABS: 5.0000 mg | ORAL_TABLET | ORAL | Status: DC | PRN

## 2022-04-23 MED ORDER — FERROUS SULFATE 325 (65 FE) MG PO TABS
325.0000 mg | ORAL_TABLET | Freq: Two times a day (BID) | ORAL | Status: DC
  Administered 2022-04-23 – 2022-04-24 (×3): 325 mg via ORAL
  Filled 2022-04-23 (×3): qty 1

## 2022-04-23 MED ORDER — BENZOCAINE-MENTHOL 20-0.5 % EX AERO
1.0000 "application " | INHALATION_SPRAY | CUTANEOUS | Status: DC | PRN
  Administered 2022-04-23: 1 via TOPICAL
  Filled 2022-04-23: qty 56

## 2022-04-23 MED ORDER — OXYCODONE HCL 5 MG PO TABS: 10.0000 mg | ORAL_TABLET | ORAL | Status: DC | PRN

## 2022-04-23 MED ORDER — COCONUT OIL OIL
1.0000 "application " | TOPICAL_OIL | Status: DC | PRN
  Administered 2022-04-23: 1 via TOPICAL
  Filled 2022-04-23: qty 120

## 2022-04-23 MED ORDER — SIMETHICONE 80 MG PO CHEW: 80.0000 mg | CHEWABLE_TABLET | ORAL | Status: DC | PRN

## 2022-04-23 MED ORDER — ONDANSETRON HCL 4 MG PO TABS: 4.0000 mg | ORAL_TABLET | ORAL | Status: DC | PRN

## 2022-04-23 MED ORDER — ACETAMINOPHEN 325 MG PO TABS: 650.0000 mg | ORAL_TABLET | ORAL | Status: DC | PRN

## 2022-04-23 MED ORDER — DIBUCAINE (PERIANAL) 1 % EX OINT: 1.0000 "application " | TOPICAL_OINTMENT | CUTANEOUS | Status: DC | PRN

## 2022-04-23 MED ORDER — DIPHENHYDRAMINE HCL 25 MG PO CAPS: 25.0000 mg | ORAL_CAPSULE | Freq: Four times a day (QID) | ORAL | Status: DC | PRN

## 2022-04-23 NOTE — Progress Notes (Signed)
Postpartum Day  1 ? ?Subjective: ?no complaints, up ad lib, voiding, and tolerating PO ? ?Doing well, no concerns. Ambulating without difficulty, pain managed with PO meds, tolerating regular diet, and voiding without difficulty.  ? ?No fever/chills, chest pain, shortness of breath, nausea/vomiting, or leg pain. No nipple or breast pain. No headache, visual changes, or RUQ/epigastric pain. ? ?Objective: ?BP 106/67 (BP Location: Left Arm)   Pulse 68   Temp 98.7 ?F (37.1 ?C) (Oral)   Resp 18   SpO2 98% Comment: room air ?  ?Physical Exam:  ?General: alert, cooperative, and no distress ?Breasts: soft/nontender ?CV: RRR ?Pulm: nl effort, CTABL ?Abdomen: soft, non-tender, active bowel sounds ?Uterine Fundus: firm ?Perineum: minimal edema, intact ?Lochia: appropriate ?DVT Evaluation: No evidence of DVT seen on physical exam. ? ?Recent Labs  ?  04/22/22 ?1848  ?HGB 11.6*  ?HCT 35.4*  ?WBC 9.1  ?PLT 223  ? ? ?Assessment/Plan: ?26 y.o. G3P1011 postpartum day # 1 ? ?-Continue routine postpartum care ?-Lactation consult PRN for breastfeeding  ?-Discussed contraceptive options including implant, IUDs hormonal and non-hormonal, injection, pills/ring/patch, condoms, and NFP. Considering condoms and spermicide  ?-Postpartum CBC due tomorrow morning  ?-Immunization status:   all immunizations up to date ? ? ?Disposition: Continue inpatient postpartum care  ? ? LOS: 1 day  ? ?Rachel Waters, CNM ?04/23/2022, 9:01 AM  ? ?----- ?Rachel Waters  ?Certified Nurse Midwife ?[facility] ?[facility] ? ?

## 2022-04-23 NOTE — Progress Notes (Signed)
Lab informed RN that patient refused CBC this morning. Patient educated and still declined. ?

## 2022-04-23 NOTE — Lactation Note (Signed)
This note was copied from a baby's chart. ?Lactation Consultation Note ? ?Patient Name: Rachel Waters ?Today's Date: 04/23/2022 ?Reason for consult: Initial assessment;Term ?Age:24 hours ? ?Initial lactation visit. ? ?Maternal Data ?Has patient been taught Hand Expression?: Yes ?Does the patient have breastfeeding experience prior to this delivery?: Yes ?How long did the patient breastfeed?: 8 months ? ?No overall concerns were reported with breastfeeding her first child. ? ?Feeding ?Mother's Current Feeding Choice: Breast Milk ? ?Baby has been put to the breast and mom is utilizing the DEBP/hand pump before feedings to assist with drawing the nipple out, improving the ability for baby to latch. ? ?LATCH Score ?  ?Mom was preparing to feed the baby; LC offered to observe feeding and provide assistance or reassurance; mom declined at this time. ? ?Lactation Tools Discussed/Used ?Tools: Pump ?Breast pump type: Double-Electric Breast Pump;Manual (pump kit was given; EBP set-up but mom mainly using the hand pump) ?Pump Education: Other (comment) (set-up by RN) ?Reason for Pumping: mom's choice ?Pumping frequency: before feedings ? ?Interventions ?Interventions: Breast feeding basics reviewed;Hand express;Pre-pump if needed;Hand pump;Education (Enjoy booklet) ? ?Reviewed early cues, feeding patterns in first 24 hours, attempts every few hours, hand expression/use of hand pump if baby is sleepy, and output expectations. ? ?Discharge ?  ? ?Consult Status ?Consult Status: Follow-up ?Date: 04/23/22 ?Follow-up type: Call as needed ? ?Encouraged to call for lactation support today if needed. ? ?Lavonia Drafts ?04/23/2022, 11:11 AM ? ? ? ?

## 2022-04-23 NOTE — Lactation Note (Signed)
This note was copied from a baby's chart. ?Lactation Consultation Note ? ?Patient Name: Rachel Waters ?Today's Date: 04/23/2022 ?Reason for consult: Follow-up assessment;Mother's request ?Age:24 hours ? ?Maternal Data ?Has patient been taught Hand Expression?: Yes ?Does the patient have breastfeeding experience prior to this delivery?: Yes ?How long did the patient breastfeed?: 8 months ? ?Parents are worried that it has been a few hours since baby last ate. They are also concerned that baby is not getting enough while at the breast. ? ?Feeding ?Mother's Current Feeding Choice: Breast Milk ? ?Baby is awake in grandfather's arms, alert. Mom states she just handpumped about 90m colostrum and desires to give to baby. ?LC provides curved tip syringe and demonstrates finger feeding with gloved finger. Baby tolerates feeding well. ? ?LATCH Score ?  ?Mom states that she will try to feed after visitors leave and [facility] ? ? ?Lactation Tools Discussed/Used ?Tools: Pump ?Breast pump type: Double-Electric Breast Pump;Manual (pump kit was given; EBP set-up but mom mainly using the hand pump) ?Pump Education: Other (comment) (set-up by RN) ?Reason for Pumping: mom's choice ?Pumping frequency: before feedings ? ?Interventions ?Interventions: Breast feeding basics reviewed;Hand pump (finger fed expressed colostrum w/ curved tip syringe, reviewed typical feeding patterns, appropriateness of lack of interest for feedings while spitty/gaggy and within first 24 hours, encouraged frequent attempts and stimulation through hand pump, STS) ? ?Reassurance given about feeding pattern, encouraged skin to skin, and frequent attempts. Discussed 12-24hr behaviors in detail and the impact of baby being spitty and gaggy can have on desire to feed. ?Reassurance also provided that baby was getting enough based on diapers reported. Output of voids and stools exceeds minimum expectations at 15HOL. ? ?Discharge ?  ? ?Consult  Status ?Consult Status: Follow-up ?Date: 04/23/22 ?Follow-up type: Call as needed ? ? ? ?SLavonia Drafts?04/23/2022, 2:20 PM ? ? ? ?

## 2022-04-24 ENCOUNTER — Ambulatory Visit: Payer: Self-pay

## 2022-04-24 LAB — T.PALLIDUM AB, TOTAL: T Pallidum Abs: NONREACTIVE

## 2022-04-24 MED ORDER — IBUPROFEN 600 MG PO TABS: 600.0000 mg | ORAL_TABLET | Freq: Four times a day (QID) | ORAL | 0 refills | Status: AC

## 2022-04-24 MED ORDER — BENZOCAINE-MENTHOL 20-0.5 % EX AERO
1.0000 | INHALATION_SPRAY | CUTANEOUS | Status: AC | PRN
Start: 2022-04-24 — End: ?

## 2022-04-24 MED ORDER — DIBUCAINE (PERIANAL) 1 % EX OINT: 1.0000 "application " | TOPICAL_OINTMENT | CUTANEOUS | Status: AC | PRN

## 2022-04-24 MED ORDER — SENNOSIDES-DOCUSATE SODIUM 8.6-50 MG PO TABS: 2.0000 | ORAL_TABLET | Freq: Every day | ORAL | Status: AC

## 2022-04-24 MED ORDER — FERROUS SULFATE 325 (65 FE) MG PO TABS: 325.0000 mg | ORAL_TABLET | Freq: Two times a day (BID) | ORAL | 3 refills | Status: AC

## 2022-04-24 MED ORDER — SIMETHICONE 80 MG PO CHEW: 80.0000 mg | CHEWABLE_TABLET | ORAL | 0 refills | Status: AC | PRN

## 2022-04-24 MED ORDER — WITCH HAZEL-GLYCERIN EX PADS: 1.0000 "application " | MEDICATED_PAD | CUTANEOUS | 12 refills | Status: AC | PRN

## 2022-04-24 MED ORDER — COCONUT OIL OIL: 1.0000 "application " | TOPICAL_OIL | 0 refills | Status: AC | PRN

## 2022-04-24 MED ORDER — ACETAMINOPHEN 325 MG PO TABS: 650.0000 mg | ORAL_TABLET | ORAL | Status: AC | PRN

## 2022-04-24 NOTE — Progress Notes (Signed)
Mother discharged.  Discharge instructions given.  Mother verbalizes understanding.  Transported by auxiliary.  

## 2022-04-24 NOTE — Lactation Note (Signed)
This note was copied from a baby's chart. ?Lactation Consultation Note ? ?Patient Name: Rachel Waters ?Today's Date: 04/24/2022 ?Reason for consult: Follow-up assessment ?Age:24 hours ? ?Maternal Data ? ?This is mom's 2nd baby. Mom's feeding plan is to pump, establish her milk supply, and provide expressed breastmilk to her baby as she did for her 1st baby. She may try to offer baby to breastfeed when she is at home. ? ?Has patient been taught Hand Expression?: Yes ?Does the patient have breastfeeding experience prior to this delivery?: Yes ?How long did the patient breastfeed?: Mom reports she exclusively pumped for 9 months with her first child and she did establish a full milk supply.She has two electric breastpumps: [place] and [place]. Mom reports she may try to offer the baby to breastfeed but she reports she likes to leave the house and go out alot and prefers to offer her milk via bottle when out. She is comfortable with her plan because she has done this before. Per mom she has no questions or lactation needs at this time. ? ?Feeding ?Mother's Current Feeding Choice: Breast Milk and Formula ? ? ?Interventions ?Interventions: Education Reviewed recommended pumping frequency to establish a milk supply and storage guidelines for breastmilk. ? ?Discharge ? ?Mom has 2 DEBP's for home use. ?  ? ?Consult Status ?Consult Status: Complete ?Date: 04/24/22 ? ?Update provided to care nurse, Lupita Leash. ? ?Fuller Song ?04/24/2022, 11:46 AM ? ? ? ?

## 2022-04-24 NOTE — Discharge Instructions (Addendum)
Discharge Instructions:  ? ?Follow-up appointment 2-week: 5/16 at 11:30am with Haroldine Laws, CNM ?Follow-up appointment 6-week: 6/14 at 9:45am with Haroldine Laws, CNM ? ? ?If there are any new medications, they have been ordered and will be available for pickup at the listed pharmacy on your way home from the hospital.  ? ?Call office if you have any of the following: headache, visual changes, fever >101.0 F, chills, shortness of breath, breast concerns, excessive vaginal bleeding, incision drainage or problems, leg pain or redness, depression or any other concerns. If you have vaginal discharge with an odor, let your doctor know.  ? ?It is normal to bleed for up to 6 weeks. You should not soak through more than 1 pad in 1 hour. If you have a blood clot larger than your fist with continued bleeding, call your doctor.  ? ?Activity: Do not lift > 10 lbs for 6 weeks (do not lift anything heavier than your baby). ?No intercourse, tampons, swimming pools, hot tubs, baths (only showers) for 6 weeks.  ?No driving for 1-2 weeks. ?Continue prenatal vitamin, especially if breastfeeding. ?Increase calories and fluids (water) while breastfeeding.  ? ?Your milk will come in, in the next couple of days (right now it is colostrum). You may have a slight fever when your milk comes in, but it should go away on its own.  If it does not, and rises above 101 F please call the doctor. You will also feel achy and your breasts will be firm. They will also start to leak. If you are breastfeeding, continue as you have been and you can pump/express milk for comfort.  ? ?If you have too much milk, your breasts can become engorged, which could lead to mastitis. This is an infection of the milk ducts. It can be very painful and you will need to notify your doctor to obtain a prescription for antibiotics. You can also treat it with a shower or hot/cold compress.  ? ?For concerns about your baby, please call your pediatrician.  ?For  breastfeeding concerns, the lactation consultant can be reached at [phone].  ? ?Postpartum blues (feelings of happy one minute and sad another minute) are normal for the first few weeks but if it gets worse let your doctor know.  ? ?Congratulations! We enjoyed caring for you and your new bundle of joy!   ?

## 2023-07-02 ENCOUNTER — Emergency Department
Admission: EM | Admit: 2023-07-02 | Discharge: 2023-07-02 | Disposition: A | Discharge status: Home / Self Care | Payer: Commercial Managed Care - PPO | Attending: Emergency Medicine | Admitting: Emergency Medicine

## 2023-07-02 ENCOUNTER — Other Ambulatory Visit: Payer: Self-pay

## 2023-07-02 ENCOUNTER — Emergency Department: Payer: Commercial Managed Care - PPO

## 2023-07-02 ENCOUNTER — Encounter: Payer: Self-pay | Admitting: Emergency Medicine

## 2023-07-02 DIAGNOSIS — O26899 Other specified pregnancy related conditions, unspecified trimester: Secondary | ICD-10-CM

## 2023-07-02 DIAGNOSIS — Z3A10 10 weeks gestation of pregnancy: Secondary | ICD-10-CM | POA: Insufficient documentation

## 2023-07-02 DIAGNOSIS — O209 Hemorrhage in early pregnancy, unspecified: Secondary | ICD-10-CM | POA: Insufficient documentation

## 2023-07-02 LAB — COMPREHENSIVE METABOLIC PANEL
ALT: 14 U/L (ref 0–44)
AST: 17 U/L (ref 15–41)
Albumin: 4 g/dL (ref 3.5–5.0)
Alkaline Phosphatase: 63 U/L (ref 38–126)
Anion gap: 7 (ref 5–15)
BUN: 9 mg/dL (ref 6–20)
CO2: 22 mmol/L (ref 22–32)
Calcium: 9 mg/dL (ref 8.9–10.3)
Chloride: 106 mmol/L (ref 98–111)
Creatinine, Ser: 0.55 mg/dL (ref 0.44–1.00)
GFR, Estimated: >60 mL/min (ref >60)
Glucose, Bld: 100 mg/dL — ABNORMAL HIGH (ref 70–99)
Potassium: 4 mmol/L (ref 3.5–5.1)
Sodium: 135 mmol/L (ref 135–145)
Total Bilirubin: 0.9 mg/dL (ref 0.3–1.2)
Total Protein: 7.1 g/dL (ref 6.5–8.1)

## 2023-07-02 LAB — CBC
HCT: 35.8 % — ABNORMAL LOW (ref 36.0–46.0)
Hemoglobin: 12 g/dL (ref 12.0–15.0)
MCH: 26.4 pg (ref 26.0–34.0)
MCHC: 33.5 g/dL (ref 30.0–36.0)
MCV: 78.9 fL — ABNORMAL LOW (ref 80.0–100.0)
Platelets: 263 10*3/uL (ref 150–400)
RBC: 4.54 MIL/uL (ref 3.87–5.11)
RDW: 15.9 % — ABNORMAL HIGH (ref 11.5–15.5)
WBC: 7.1 10*3/uL (ref 4.0–10.5)
nRBC: 0 % (ref 0.0–0.2)

## 2023-07-02 LAB — URINALYSIS, ROUTINE W REFLEX MICROSCOPIC
Bacteria, UA: NONE SEEN
Bilirubin Urine: NEGATIVE
Glucose, UA: NEGATIVE mg/dL
Hgb urine dipstick: NEGATIVE
Ketones, ur: 5 mg/dL — AB
Nitrite: NEGATIVE
Protein, ur: NEGATIVE mg/dL
Specific Gravity, Urine: 1.018 (ref 1.005–1.030)
pH: 6 (ref 5.0–8.0)

## 2023-07-02 LAB — POC URINE PREG, ED: Preg Test, Ur: POSITIVE — AB

## 2023-07-02 LAB — HCG, QUANTITATIVE, PREGNANCY: hCG, Beta Chain, Quant, S: 111453 m[IU]/mL — ABNORMAL HIGH (ref <5)

## 2023-07-02 LAB — LIPASE, BLOOD: Lipase: 30 U/L (ref 11–51)

## 2023-07-02 MED ORDER — RHO D IMMUNE GLOBULIN 1500 UNIT/2ML IJ SOSY
300.0000 ug | PREFILLED_SYRINGE | Freq: Once | INTRAMUSCULAR | Status: DC
  Filled 2023-07-02: qty 2

## 2023-07-02 NOTE — ED Triage Notes (Addendum)
Pt to ED via POV for abdominal pain. Pt thinks she is about [redacted] weeks pregnant. Pt called her OB this morning and they advised her to come to the ED for evaluation. Pt denies vaginal bleeding. Pt states that pain feel like a "pulling pain". Pt denies cramping. Pt states that she has hx/o ectopic pregnancy.

## 2023-07-02 NOTE — Discharge Instructions (Addendum)
Your testing in the emergency department did not show any emergency conditions like significant blood loss, or ectopic pregnancy.  Ultrasound showed "Single living IUP with estimated gestational age of [redacted] weeks 5 days" associated small subchorionic bleed which should be followed up by your gynecologist.  Please call them for an appointment this week.  Thank you for choosing [place] for your health care today!  Please see your primary doctor this week for a follow up appointment.   If you have any new, worsening, or unexpected symptoms call your doctor right away or come back to the emergency department for reevaluation.  It was my pleasure to care for you today.   Daneil Dan Modesto Charon, MD

## 2023-07-02 NOTE — ED Provider Notes (Addendum)
[facility] Provider Note    Event Date/Time   First MD Initiated Contact with Patient 07/02/23 1334     (approximate)   History   Abdominal Pain   HPI  Rachel Waters is a 27 y.o. female   Past medical history of ectopic pregnancy who presents to the emergency department with lower abdominal cramping pain and vaginal spotting early in pregnancy.  By last menstrual period approximate [redacted] weeks pregnant.  Has not yet established gynecologic care during this pregnancy yet, upcoming in the next 1 to 2 weeks.  Symptoms developed last night with some vaginal spotting which resolved today but then developed lower abdominal cramping pain.  No vaginal discharge no urinary symptoms.  No fevers or chills    Independent Historian contributed to assessment above: Her partner is at bedside to corroborate information given above.       Physical Exam   Triage Vital Signs: ED Triage Vitals [07/02/23 1249]  Enc Vitals Group     BP 115/70     Pulse Rate 75     Resp 16     Temp 98.4 F (36.9 C)     Temp Source Oral     SpO2 96 %     Weight 110 lb (49.9 kg)     Height 5\' 1"  (1.549 m)     Head Circumference      Peak Flow      Pain Score 6     Pain Loc      Pain Edu?      Excl. in GC?     Most recent vital signs: Vitals:   07/02/23 1249  BP: 115/70  Pulse: 75  Resp: 16  Temp: 98.4 F (36.9 C)  SpO2: 96%    General: Awake, no distress.  CV:  Good peripheral perfusion.  Resp:  Normal effort.  Abd:  No distention.  Other:  Lower abdominal bilateral and central mild tenderness to palpation without rigidity or guarding.  Vital signs within normal limits afebrile comfortable appearing nontoxic patient.   ED Results / Procedures / Treatments   Labs (all labs ordered are listed, but only abnormal results are displayed) Labs Reviewed  COMPREHENSIVE METABOLIC PANEL - Abnormal; Notable for the following components:      Result Value   Glucose,  Bld 100 (*)    All other components within normal limits  CBC - Abnormal; Notable for the following components:   HCT 35.8 (*)    MCV 78.9 (*)    RDW 15.9 (*)    All other components within normal limits  URINALYSIS, ROUTINE W REFLEX MICROSCOPIC - Abnormal; Notable for the following components:   Color, Urine YELLOW (*)    APPearance CLEAR (*)    Ketones, ur 5 (*)    Leukocytes,Ua TRACE (*)    All other components within normal limits  HCG, QUANTITATIVE, PREGNANCY - Abnormal; Notable for the following components:   hCG, Beta Chain, Quant, S 111,453 (*)    All other components within normal limits  POC URINE PREG, ED - Abnormal; Notable for the following components:   Preg Test, Ur Positive (*)    All other components within normal limits  LIPASE, BLOOD     I ordered and reviewed the above labs they are notable for beta hCG is 111000 her H&H is within normal limit  Radiology- I independently interpreted the ultrasound of the pelvis a single live intrauterine pregnancy  PROCEDURES:  Critical Care performed:  No  Procedures   MEDICATIONS ORDERED IN ED: Medications - No data to display  IMPRESSION / MDM / ASSESSMENT AND PLAN / ED COURSE  I reviewed the triage vital signs and the nursing notes.                                Patient's presentation is most consistent with acute presentation with potential threat to life or bodily function.  Differential diagnosis includes, but is not limited to, ectopic pregnancy, threatened miscarriage, miscarriage, ovarian cyst ovarian torsion urinary tract infection   The patient is on the cardiac monitor to evaluate for evidence of arrhythmia and/or significant heart rate changes.  MDM: History of ectopic pregnancy will obtain transvaginal ultrasound to rule out.  Check urine for bacteria urinary tract infection.  Rh+ defer RhoGAM.  Hemodynamics appropriate reassuring and H&H within normal limits no active bleeding doubt significant  blood loss anemia.  Considered other intra-abdominal surgical  pathologies like appendicitis, cholecystitis, obstruction but doubt given normal white blood cell count, LFTs and normal Bms.    Reassessed patient was comfortable pain is subsided at this time.  No more bleeding.   Awaiting results of urine, will give antibiotics if bacteria present, and if the ultrasound shows no emergency conditions like ectopic pregnancy plan will be for discharge and close gynecologic follow-up.  -- Live intrauterine pregnancy detected on ultrasound.  No signs of ovarian torsion.  Small subchorionic hemorrhage.  Patient stable.  Plan for discharge with close gynecology follow-up.       FINAL CLINICAL IMPRESSION(S) / ED DIAGNOSES   Final diagnoses:  Abdominal pain affecting pregnancy  Vaginal bleeding affecting early pregnancy     Rx / DC Orders   ED Discharge Orders     None        Note:  This document was prepared using Dragon voice recognition software and may include unintentional dictation errors.    Pilar Jarvis, MD 07/02/23 1542    Pilar Jarvis, MD 07/02/23 [phone]

## 2023-07-02 NOTE — ED Notes (Signed)
See triage note  Presents with some vaginal bleeding and vaginal pressure  States she is approx 10 pregnant
